# Patient Record
Sex: Female | Born: 1985
Health system: Southern US, Community
[De-identification: ages and names within clinical notes are randomized; demographics above are authoritative.]

## PROBLEM LIST (undated history)

## (undated) DIAGNOSIS — O039 Complete or unspecified spontaneous abortion without complication: Secondary | ICD-10-CM

## (undated) HISTORY — DX: Complete or unspecified spontaneous abortion without complication: O03.9

## (undated) HISTORY — PX: NO PAST SURGERIES: SHX2092

---

## 2012-01-03 DIAGNOSIS — Z3400 Encounter for supervision of normal first pregnancy, unspecified trimester: Secondary | ICD-10-CM | POA: Insufficient documentation

## 2012-05-26 DIAGNOSIS — B3731 Acute candidiasis of vulva and vagina: Secondary | ICD-10-CM | POA: Insufficient documentation

## 2012-05-26 DIAGNOSIS — G5603 Carpal tunnel syndrome, bilateral upper limbs: Secondary | ICD-10-CM | POA: Insufficient documentation

## 2012-06-11 DIAGNOSIS — Z3400 Encounter for supervision of normal first pregnancy, unspecified trimester: Secondary | ICD-10-CM | POA: Insufficient documentation

## 2012-06-17 DIAGNOSIS — O99820 Streptococcus B carrier state complicating pregnancy: Secondary | ICD-10-CM | POA: Insufficient documentation

## 2016-09-13 ENCOUNTER — Encounter: Payer: Self-pay | Admitting: Emergency Medicine

## 2016-09-13 ENCOUNTER — Ambulatory Visit
Admission: EM | Admit: 2016-09-13 | Discharge: 2016-09-13 | Disposition: A | Discharge status: Home / Self Care | Payer: BLUE CROSS/BLUE SHIELD | Attending: Family Medicine | Admitting: Family Medicine

## 2016-09-13 DIAGNOSIS — J0100 Acute maxillary sinusitis, unspecified: Secondary | ICD-10-CM

## 2016-09-13 LAB — RAPID STREP SCREEN (MED CTR MEBANE ONLY): Streptococcus, Group A Screen (Direct): NEGATIVE

## 2016-09-13 MED ORDER — AMOXICILLIN 875 MG PO TABS: 875.0000 mg | ORAL_TABLET | Freq: Two times a day (BID) | ORAL | 0 refills | Status: DC

## 2016-09-13 NOTE — ED Provider Notes (Signed)
[facility]    CSN: [id number] Arrival date & time: 09/13/16  0847     History   Chief Complaint Chief Complaint  Patient presents with  . Otalgia    left ear  . Sore Throat    HPI Sara Burch is a [age] y.o. female.   The history is provided by the patient.  Otalgia  Associated symptoms: congestion, headaches and sore throat   Sore Throat  Associated symptoms include headaches.  URI  Presenting symptoms: congestion, ear pain, facial pain, fatigue and sore throat   Severity:  Moderate Onset quality:  Sudden Duration:  2 weeks Timing:  Constant Progression:  Worsening Chronicity:  New Relieved by:  Nothing Ineffective treatments:  OTC medications Associated symptoms: headaches and sinus pain   Associated symptoms: no wheezing   Risk factors: sick contacts   Risk factors: not elderly, no chronic cardiac disease, no chronic kidney disease, no chronic respiratory disease, no diabetes mellitus, no immunosuppression, no recent illness and no recent travel     History reviewed. No pertinent past medical history.  There are no active problems to display for this patient.   History reviewed. No pertinent surgical history.  OB History    No data available       Home Medications    Prior to Admission medications   Medication Sig Start Date End Date Taking? Authorizing Provider  amoxicillin (AMOXIL) 875 MG tablet Take 1 tablet (875 mg total) by mouth 2 (two) times daily. 09/13/16   Sara Mccallumrlando Sara Javier, MD    Family History History reviewed. No pertinent family history.  Social History Social History  Substance Use Topics  . Smoking status: Never Smoker  . Smokeless tobacco: Never Used  . Alcohol use Yes     Allergies   Patient has no known allergies.   Review of Systems Review of Systems  Constitutional: Positive for fatigue.  HENT: Positive for congestion, ear pain, sinus pain and sore throat.   Respiratory: Negative for wheezing.     Neurological: Positive for headaches.     Physical Exam Triage Vital Signs ED Triage Vitals  Enc Vitals Group     BP 09/13/16 0928 124/78     Pulse Rate 09/13/16 0928 78     Resp 09/13/16 0928 16     Temp 09/13/16 0928 98.1 F (36.7 C)     Temp Source 09/13/16 0928 Oral     SpO2 09/13/16 0928 100 %     Weight 09/13/16 0926 190 lb (86.2 kg)     Height 09/13/16 0926 5\' 6"  (1.676 m)     Head Circumference --      Peak Flow --      Pain Score 09/13/16 0928 4     Pain Loc --      Pain Edu? --      Excl. in GC? --    No data found.   Updated Vital Signs BP 124/78 (BP Location: Left Arm)   Pulse 78   Temp 98.1 F (36.7 C) (Oral)   Resp 16   Ht 5\' 6"  (1.676 m)   Wt 190 lb (86.2 kg)   LMP 08/31/2016 (Exact Date)   SpO2 100%   BMI 30.67 kg/m   Visual Acuity Right Eye Distance:   Left Eye Distance:   Bilateral Distance:    Right Eye Near:   Left Eye Near:    Bilateral Near:     Physical Exam  Constitutional: She appears well-developed and well-nourished. No  distress.  HENT:  Head: Normocephalic and atraumatic.  Right Ear: Tympanic membrane, external ear and ear canal normal.  Left Ear: Tympanic membrane, external ear and ear canal normal.  Nose: Mucosal edema and rhinorrhea present. No nose lacerations, sinus tenderness, nasal deformity, septal deviation or nasal septal hematoma. No epistaxis.  No foreign bodies. Right sinus exhibits maxillary sinus tenderness and frontal sinus tenderness. Left sinus exhibits maxillary sinus tenderness and frontal sinus tenderness.  Mouth/Throat: Uvula is midline, oropharynx is clear and moist and mucous membranes are normal. No oropharyngeal exudate.  Eyes: Conjunctivae and EOM are normal. Pupils are equal, round, and reactive to light. Right eye exhibits no discharge. Left eye exhibits no discharge. No scleral icterus.  Neck: Normal range of motion. Neck supple. No thyromegaly present.  Cardiovascular: Normal rate, regular rhythm  and normal heart sounds.   Pulmonary/Chest: Effort normal and breath sounds normal. No respiratory distress. She has no wheezes. She has no rales.  Lymphadenopathy:    She has no cervical adenopathy.  Skin: She is not diaphoretic.  Nursing note and vitals reviewed.    UC Treatments / Results  Labs (all labs ordered are listed, but only abnormal results are displayed) Labs Reviewed  RAPID STREP SCREEN (NOT AT [facility])  CULTURE, GROUP A STREP [facility])    EKG  EKG Interpretation None       Radiology No results found.  Procedures Procedures (including critical care time)  Medications Ordered in UC Medications - No data to display   Initial Impression / Assessment and Plan / UC Course  I have reviewed the triage vital signs and the nursing notes.  Pertinent labs & imaging results that were available during my care of the patient were reviewed by me and considered in my medical decision making (see chart for details).       Final Clinical Impressions(s) / UC Diagnoses   Final diagnoses:  Acute maxillary sinusitis, recurrence not specified    New Prescriptions Discharge Medication List as of 09/13/2016  9:54 AM    START taking these medications   Details  amoxicillin (AMOXIL) 875 MG tablet Take 1 tablet (875 mg total) by mouth 2 (two) times daily., Starting Sat 09/13/2016, Normal       1. diagnosis reviewed with patient 2. rx as per orders above; reviewed possible side effects, interactions, risks and benefits  3. Recommend supportive treatment with otc flonase 4. Follow-up prn if symptoms worsen or don't improve   Sara Mccallum, MD 09/13/16 1240

## 2016-09-13 NOTE — ED Triage Notes (Signed)
Patient c/o sore throat and left ear pain, runny nose, and congestion for 2 weeks.  Patient denies fevers.

## 2016-09-16 LAB — CULTURE, GROUP A STREP (THRC)

## 2018-07-23 ENCOUNTER — Other Ambulatory Visit: Payer: Self-pay

## 2018-07-23 ENCOUNTER — Ambulatory Visit
Admission: EM | Admit: 2018-07-23 | Discharge: 2018-07-23 | Disposition: A | Discharge status: Home / Self Care | Payer: BLUE CROSS/BLUE SHIELD | Attending: Family Medicine | Admitting: Family Medicine

## 2018-07-23 ENCOUNTER — Encounter: Payer: Self-pay | Admitting: Emergency Medicine

## 2018-07-23 DIAGNOSIS — R509 Fever, unspecified: Secondary | ICD-10-CM | POA: Diagnosis not present

## 2018-07-23 DIAGNOSIS — R51 Headache: Secondary | ICD-10-CM | POA: Diagnosis not present

## 2018-07-23 DIAGNOSIS — J111 Influenza due to unidentified influenza virus with other respiratory manifestations: Secondary | ICD-10-CM

## 2018-07-23 DIAGNOSIS — M791 Myalgia, unspecified site: Secondary | ICD-10-CM | POA: Diagnosis not present

## 2018-07-23 DIAGNOSIS — J029 Acute pharyngitis, unspecified: Secondary | ICD-10-CM | POA: Diagnosis not present

## 2018-07-23 DIAGNOSIS — R69 Illness, unspecified: Secondary | ICD-10-CM | POA: Insufficient documentation

## 2018-07-23 LAB — RAPID INFLUENZA A&B ANTIGENS (ARMC ONLY)
INFLUENZA A (ARMC): NEGATIVE
INFLUENZA B (ARMC): NEGATIVE

## 2018-07-23 LAB — RAPID STREP SCREEN (MED CTR MEBANE ONLY): Streptococcus, Group A Screen (Direct): NEGATIVE

## 2018-07-23 MED ORDER — IBUPROFEN 800 MG PO TABS
800.0000 mg | ORAL_TABLET | Freq: Once | ORAL | Status: AC
  Administered 2018-07-23: 800 mg via ORAL

## 2018-07-23 MED ORDER — OSELTAMIVIR PHOSPHATE 75 MG PO CAPS: 75.0000 mg | ORAL_CAPSULE | Freq: Two times a day (BID) | ORAL | 0 refills | Status: DC

## 2018-07-23 NOTE — ED Notes (Signed)
Pt in treatment room.  In NAD.  Needs address.  Pt/Fam updated on POC.   

## 2018-07-23 NOTE — ED Triage Notes (Signed)
Pt c/o headache, body aches, chills, and low grade fever. Started this morning.

## 2018-07-23 NOTE — Discharge Instructions (Signed)
Rest. Fluids.  Medication as prescribed.   Take care  Dr. Amier Hoyt  

## 2018-07-23 NOTE — ED Provider Notes (Signed)
[facility]    CSN: [id number] Arrival date & time: 07/23/18  1239  History   Chief Complaint Chief Complaint  Patient presents with  . Generalized Body Aches   HPI  33 year old female presents with fever, chills, headaches, body aches, sore throat.  Patient states that her symptoms started this morning.  She reports headache, body aches, chills, fever, and sore throat.  Symptoms are severe.  She feels very poorly.  No medication or interventions tried.  No known exacerbating relieving factors.  She has had recent sick contacts.  No other reported symptoms.  No other complaints/concerns at this time.  PMH, Surgical Hx, Family Hx, Social History reviewed and updated as below.  PMH: Acne.  Past Surgical History:  Procedure Laterality Date  . NO PAST SURGERIES     Home Medications    Prior to Admission medications   Medication Sig Start Date End Date Taking? Authorizing Provider  Cod Liver Oil 10 MINIM CAPS Take by mouth.   Yes [provider]  Multiple Vitamins-Minerals (MULTIVITAMIN ADULT PO) Take by mouth.   Yes [provider]  oseltamivir (TAMIFLU) 75 MG capsule Take 1 capsule (75 mg total) by mouth every 12 (twelve) hours. 07/23/18   Tommie Samsook, Geralene Afshar G, DO    Family History Family History  Problem Relation Age of Onset  . Healthy Mother   . Diabetes Father     Social History Social History   Tobacco Use  . Smoking status: Never Smoker  . Smokeless tobacco: Never Used  Substance Use Topics  . Alcohol use: Yes  . Drug use: No     Allergies   Patient has no known allergies.   Review of Systems Review of Systems Per HPI  Physical Exam Triage Vital Signs ED Triage Vitals  Enc Vitals Group     BP 07/23/18 1306 120/81     Pulse Rate 07/23/18 1306 93     Resp 07/23/18 1306 18     Temp 07/23/18 1306 (!) 101.2 F (38.4 C)     Temp Source 07/23/18 1306 Oral     SpO2 07/23/18 1306 100 %     Weight 07/23/18 1301 185 lb (83.9 kg)       Height 07/23/18 1301 5\' 6"  (1.676 m)     Head Circumference --      Peak Flow --      Pain Score 07/23/18 1301 5     Pain Loc --      Pain Edu? --      Excl. in GC? --    Updated Vital Signs BP 120/81 (BP Location: Left Arm)   Pulse 93   Temp (!) 101.2 F (38.4 C) (Oral)   Resp 18   Ht 5\' 6"  (1.676 m)   Wt 83.9 kg   LMP 07/15/2018 (Approximate)   SpO2 100%   BMI 29.86 kg/m   Visual Acuity Right Eye Distance:   Left Eye Distance:   Bilateral Distance:    Right Eye Near:   Left Eye Near:    Bilateral Near:     Physical Exam Vitals signs and nursing note reviewed.  Constitutional:      General: She is not in acute distress. HENT:     Head: Normocephalic and atraumatic.     Right Ear: Tympanic membrane normal.     Left Ear: Tympanic membrane normal.     Nose: Nose normal.     Mouth/Throat:     Comments: Oropharynx with mild to  moderate erythema.  No exudates.  Cobblestoning noted. Eyes:     General:        Right eye: No discharge.        Left eye: No discharge.     Conjunctiva/sclera: Conjunctivae normal.  Cardiovascular:     Rate and Rhythm: Normal rate and regular rhythm.  Pulmonary:     Effort: Pulmonary effort is normal.     Breath sounds: No wheezing, rhonchi or rales.  Neurological:     Mental Status: She is alert.  Psychiatric:        Mood and Affect: Mood normal.        Behavior: Behavior normal.    UC Treatments / Results  Labs (all labs ordered are listed, but only abnormal results are displayed) Labs Reviewed  RAPID INFLUENZA A&B ANTIGENS ([facility] ONLY)  RAPID STREP SCREEN ([facility] ONLY)  CULTURE, GROUP A STREP [facility])    EKG None  Radiology No results found.  Procedures Procedures (including critical care time)  Medications Ordered in UC Medications  ibuprofen (ADVIL,MOTRIN) tablet 800 mg (800 mg Oral Given 07/23/18 1411)    Initial Impression / Assessment and Plan / UC Course  I have reviewed the triage vital signs and  the nursing notes.  Pertinent labs & imaging results that were available during my care of the patient were reviewed by me and considered in my medical decision making (see chart for details).    33 year old female presents with an influenza-like illness.  Her testing was negative today.  However, clinically I am suspicious that she has influenza.  Treating with Tamiflu.  Final Clinical Impressions(s) / UC Diagnoses   Final diagnoses:  Influenza-like illness     Discharge Instructions     Rest.   Fluids.  Medication as prescribed.  Take care  Dr. Adriana Simas    ED Prescriptions    Medication Sig Dispense Auth. Provider   oseltamivir (TAMIFLU) 75 MG capsule Take 1 capsule (75 mg total) by mouth every 12 (twelve) hours. 10 capsule Tommie Sams, DO     Controlled Substance Prescriptions [place] Controlled Substance Registry consulted? Not Applicable   Tommie Sams, DO 07/23/18 1507

## 2018-07-26 LAB — CULTURE, GROUP A STREP (THRC)

## 2019-04-02 ENCOUNTER — Other Ambulatory Visit: Payer: Self-pay | Admitting: General Practice

## 2019-04-02 DIAGNOSIS — Z20822 Contact with and (suspected) exposure to covid-19: Secondary | ICD-10-CM

## 2019-04-03 LAB — NOVEL CORONAVIRUS, NAA: SARS-CoV-2, NAA: NOT DETECTED

## 2019-04-05 ENCOUNTER — Telehealth: Payer: Self-pay | Admitting: General Practice

## 2019-04-05 NOTE — Telephone Encounter (Signed)
Negative COVID results given. Patient results "NOT Detected." Caller expressed understanding. ° °

## 2019-04-19 ENCOUNTER — Other Ambulatory Visit: Payer: Self-pay

## 2019-04-19 DIAGNOSIS — Z20822 Contact with and (suspected) exposure to covid-19: Secondary | ICD-10-CM

## 2019-04-21 LAB — NOVEL CORONAVIRUS, NAA: SARS-CoV-2, NAA: NOT DETECTED

## 2019-04-27 ENCOUNTER — Telehealth: Payer: Self-pay | Admitting: General Practice

## 2019-04-27 NOTE — Telephone Encounter (Signed)
Pt returned call for covid results.  ° °Advised of Not Detected result.  °

## 2019-05-24 DIAGNOSIS — R69 Illness, unspecified: Secondary | ICD-10-CM | POA: Diagnosis not present

## 2019-09-16 ENCOUNTER — Ambulatory Visit: Payer: Medicaid Other | Attending: Internal Medicine

## 2019-09-16 DIAGNOSIS — Z23 Encounter for immunization: Secondary | ICD-10-CM

## 2019-09-16 NOTE — Progress Notes (Signed)
   Covid-19 Vaccination Clinic  Name:  Sara Burch    MRN: [id number] DOB: 1986-03-05  09/16/2019  [name] was observed post Covid-19 immunization for 15 minutes without incident. She was provided with Vaccine Information Sheet and instruction to access the V-Safe system.   [name] was instructed to call 911 with any severe reactions post vaccine: Marland Kitchen Difficulty breathing  . Swelling of face and throat  . A fast heartbeat  . A bad rash all over body  . Dizziness and weakness   Immunizations Administered    Name Date Dose VIS Date Route   Moderna COVID-19 Vaccine 09/16/2019  9:42 AM 0.5 mL 06/07/2019 Intramuscular   Manufacturer: Moderna   Lot: [id number]   NDC: 16606-301-60

## 2019-10-18 ENCOUNTER — Ambulatory Visit: Payer: Medicaid Other | Attending: Internal Medicine

## 2019-10-18 DIAGNOSIS — Z23 Encounter for immunization: Secondary | ICD-10-CM

## 2019-10-18 NOTE — Progress Notes (Signed)
   Covid-19 Vaccination Clinic  Name:  Sara Burch    MRN: [id number] DOB: [birth year]  10/18/2019  Sara Burch was observed post Covid-19 immunization for 15 minutes without incident. She was provided with Vaccine Information Sheet and instruction to access the V-Safe system.   Sara Burch was instructed to call 911 with any severe reactions post vaccine: Marland Kitchen Difficulty breathing  . Swelling of face and throat  . A fast heartbeat  . A bad rash all over body  . Dizziness and weakness   Immunizations Administered    Name Date Dose VIS Date Route   Moderna COVID-19 Vaccine 10/18/2019  9:36 AM 0.5 mL 06/07/2019 Intramuscular   Manufacturer: Moderna   Lot: [id number]   NDC: 02774-128-78

## 2019-12-14 DIAGNOSIS — Z8739 Personal history of other diseases of the musculoskeletal system and connective tissue: Secondary | ICD-10-CM | POA: Insufficient documentation

## 2019-12-14 NOTE — Progress Notes (Signed)
Scheduled to complete physical 12/21/19 with Ron Smith, PA-C.  AMD 

## 2019-12-15 ENCOUNTER — Other Ambulatory Visit: Payer: Self-pay

## 2019-12-15 ENCOUNTER — Ambulatory Visit: Payer: Self-pay

## 2019-12-15 DIAGNOSIS — Z0000 Encounter for general adult medical examination without abnormal findings: Secondary | ICD-10-CM

## 2019-12-15 LAB — POCT URINALYSIS DIPSTICK
Bilirubin, UA: NEGATIVE
Glucose, UA: NEGATIVE
Ketones, UA: NEGATIVE
Leukocytes, UA: NEGATIVE
Nitrite, UA: NEGATIVE
Protein, UA: NEGATIVE
Spec Grav, UA: 1.015 (ref 1.010–1.025)
Urobilinogen, UA: 0.2 E.U./dL
pH, UA: 7 (ref 5.0–8.0)

## 2019-12-16 LAB — CMP12+LP+TP+TSH+6AC+CBC/D/PLT
ALT: 41 IU/L — ABNORMAL HIGH (ref 0–32)
AST: 32 IU/L (ref 0–40)
Albumin/Globulin Ratio: 1.7 (ref 1.2–2.2)
Albumin: 4.4 g/dL (ref 3.8–4.8)
Alkaline Phosphatase: 66 IU/L (ref 48–121)
BUN/Creatinine Ratio: 15 (ref 9–23)
BUN: 9 mg/dL (ref 6–20)
Basophils Absolute: 0 10*3/uL (ref 0.0–0.2)
Basos: 1 %
Bilirubin Total: 0.3 mg/dL (ref 0.0–1.2)
Calcium: 8.9 mg/dL (ref 8.7–10.2)
Chloride: 103 mmol/L (ref 96–106)
Chol/HDL Ratio: 3.7 ratio (ref 0.0–4.4)
Cholesterol, Total: 198 mg/dL (ref 100–199)
Creatinine, Ser: 0.6 mg/dL (ref 0.57–1.00)
EOS (ABSOLUTE): 0.1 10*3/uL (ref 0.0–0.4)
Eos: 2 %
Estimated CHD Risk: 0.7 times avg. (ref 0.0–1.0)
Free Thyroxine Index: 1.8 (ref 1.2–4.9)
GFR calc Af Amer: 138 mL/min/{1.73_m2} (ref >59)
GFR calc non Af Amer: 119 mL/min/{1.73_m2} (ref >59)
GGT: 17 IU/L (ref 0–60)
Globulin, Total: 2.6 g/dL (ref 1.5–4.5)
Glucose: 132 mg/dL — ABNORMAL HIGH (ref 65–99)
HDL: 54 mg/dL (ref >39)
Hematocrit: 38.1 % (ref 34.0–46.6)
Hemoglobin: 12.5 g/dL (ref 11.1–15.9)
Immature Grans (Abs): 0 10*3/uL (ref 0.0–0.1)
Immature Granulocytes: 0 %
Iron: 43 ug/dL (ref 27–159)
LDH: 186 IU/L (ref 119–226)
LDL Chol Calc (NIH): 134 mg/dL — ABNORMAL HIGH (ref 0–99)
Lymphocytes Absolute: 1.7 10*3/uL (ref 0.7–3.1)
Lymphs: 45 %
MCH: 29.8 pg (ref 26.6–33.0)
MCHC: 32.8 g/dL (ref 31.5–35.7)
MCV: 91 fL (ref 79–97)
Monocytes Absolute: 0.3 10*3/uL (ref 0.1–0.9)
Monocytes: 8 %
Neutrophils Absolute: 1.6 10*3/uL (ref 1.4–7.0)
Neutrophils: 44 %
Phosphorus: 2.6 mg/dL — ABNORMAL LOW (ref 3.0–4.3)
Platelets: 184 10*3/uL (ref 150–450)
Potassium: 4.3 mmol/L (ref 3.5–5.2)
RBC: 4.2 x10E6/uL (ref 3.77–5.28)
RDW: 12.8 % (ref 11.7–15.4)
Sodium: 139 mmol/L (ref 134–144)
T3 Uptake Ratio: 30 % (ref 24–39)
T4, Total: 6.1 ug/dL (ref 4.5–12.0)
TSH: 1.38 u[IU]/mL (ref 0.450–4.500)
Total Protein: 7 g/dL (ref 6.0–8.5)
Triglycerides: 56 mg/dL (ref 0–149)
Uric Acid: 4.1 mg/dL (ref 2.6–6.2)
VLDL Cholesterol Cal: 10 mg/dL (ref 5–40)
WBC: 3.7 10*3/uL (ref 3.4–10.8)

## 2019-12-21 ENCOUNTER — Ambulatory Visit: Payer: Self-pay | Admitting: Physician Assistant

## 2019-12-21 ENCOUNTER — Other Ambulatory Visit: Payer: Self-pay

## 2019-12-21 ENCOUNTER — Encounter: Payer: Self-pay | Admitting: Physician Assistant

## 2019-12-21 VITALS — BP 138/83 | Resp 14 | Ht 66.0 in | Wt 213.0 lb

## 2019-12-21 DIAGNOSIS — Z0000 Encounter for general adult medical examination without abnormal findings: Secondary | ICD-10-CM

## 2019-12-21 DIAGNOSIS — R7309 Other abnormal glucose: Secondary | ICD-10-CM

## 2019-12-21 LAB — POCT GLYCOSYLATED HEMOGLOBIN (HGB A1C): Hemoglobin A1C: 5.7 % — AB (ref 4.0–5.6)

## 2019-12-21 NOTE — Progress Notes (Signed)
° °  Subjective: Annual physical exam    Patient ID: Sara Burch, female    DOB: [birth year], 34 y.o.   MRN: [id number]  HPI Patient presents for annual physical exam voices no concerns or complaints.   Review of Systems    Negative Objective:   Physical Exam No acute distress.  BMI is 34.38.  HEENT is unremarkable.  Neck is supple for adenopathy or bruits.  Lungs are clear to auscultation.  Heart is regular rate and rhythm.  Abdomen negative HSM, normoactive bowel sounds, soft, and nontender to palpation.  No obvious deformity to the upper or lower extremities.  Patient has full and equal range of motion of the upper and lower extremities.  No obvious deformity to the cervical or lumbar spine.  Patient has full equal range of motion of the cervical and lumbar spine.  Cranial nerves II through XII grossly intact.       Assessment & Plan: Well exam.  Patient fasting glucose was 132.  Due to the patient's history of diabetes on both sides of the family will get a hemoglobin A1c

## 2020-07-10 ENCOUNTER — Encounter: Payer: Medicaid Other | Admitting: Obstetrics and Gynecology

## 2020-07-12 ENCOUNTER — Ambulatory Visit (INDEPENDENT_AMBULATORY_CARE_PROVIDER_SITE_OTHER): Payer: 59 | Admitting: Obstetrics and Gynecology

## 2020-07-12 ENCOUNTER — Other Ambulatory Visit: Payer: Self-pay

## 2020-07-12 ENCOUNTER — Encounter: Payer: Self-pay | Admitting: Obstetrics and Gynecology

## 2020-07-12 VITALS — BP 140/67 | HR 105 | Ht 66.0 in | Wt 217.6 lb

## 2020-07-12 DIAGNOSIS — O09521 Supervision of elderly multigravida, first trimester: Secondary | ICD-10-CM

## 2020-07-12 DIAGNOSIS — Z8759 Personal history of other complications of pregnancy, childbirth and the puerperium: Secondary | ICD-10-CM | POA: Diagnosis not present

## 2020-07-12 DIAGNOSIS — N926 Irregular menstruation, unspecified: Secondary | ICD-10-CM

## 2020-07-12 LAB — POCT URINE PREGNANCY: Preg Test, Ur: POSITIVE — AB

## 2020-07-12 NOTE — Progress Notes (Signed)
HPI:      [name] is a 35 y.o. G1P0 who LMP was Patient's last menstrual period was 05/25/2020.  Subjective:   She presents today for pregnancy confirmation.  She has missed a menstrual period.  She was not preventing pregnancy and is excited about being pregnant.  She is currently taking prenatal vitamins.  She has occasional nausea but no vomiting. Her previous birth was complicated by 9 pound 6 ounce infant vaginal birth but otherwise without issue.  Patient desires natural childbirth with this baby.  She did breast-feed last pregnancy and plans to breast-feed again.    Hx: The following portions of the patient's history were reviewed and updated as appropriate:             She  has no past medical history on file. She does not have any pertinent problems on file. She  has a past surgical history that includes No past surgeries. Her family history includes Diabetes in her father; Healthy in her mother. She  reports that she has never smoked. She has never used smokeless tobacco. She reports current alcohol use. She reports that she does not use drugs. She has a current medication list which includes the following prescription(s): apple cider vinegar, cetirizine, cod liver oil, and multivitamin-prenatal. She has No Known Allergies.       Review of Systems:  Review of Systems  Constitutional: Denied constitutional symptoms, night sweats, recent illness, fatigue, fever, insomnia and weight loss.  Eyes: Denied eye symptoms, eye pain, photophobia, vision change and visual disturbance.  Ears/Nose/Throat/Neck: Denied ear, nose, throat or neck symptoms, hearing loss, nasal discharge, sinus congestion and sore throat.  Cardiovascular: Denied cardiovascular symptoms, arrhythmia, chest pain/pressure, edema, exercise intolerance, orthopnea and palpitations.  Respiratory: Denied pulmonary symptoms, asthma, pleuritic pain, productive sputum, cough, dyspnea and wheezing.  Gastrointestinal:  Denied, gastro-esophageal reflux, melena, nausea and vomiting.  Genitourinary: Denied genitourinary symptoms including symptomatic vaginal discharge, pelvic relaxation issues, and urinary complaints.  Musculoskeletal: Denied musculoskeletal symptoms, stiffness, swelling, muscle weakness and myalgia.  Dermatologic: Denied dermatology symptoms, rash and scar.  Neurologic: Denied neurology symptoms, dizziness, headache, neck pain and syncope.  Psychiatric: Denied psychiatric symptoms, anxiety and depression.  Endocrine: Denied endocrine symptoms including hot flashes and night sweats.   Meds:   Current Outpatient Medications on File Prior to Visit  Medication Sig Dispense Refill  . APPLE CIDER VINEGAR PO Take by mouth. liquid    . cetirizine (ZYRTEC) 10 MG tablet Take by mouth.    Marland Kitchen Cod Liver Oil 10 MINIM CAPS Take by mouth.    . Prenatal Vit-Fe Fumarate-FA (MULTIVITAMIN-PRENATAL) 27-0.8 MG TABS tablet Take 1 tablet by mouth daily at 12 noon.     No current facility-administered medications on file prior to visit.          Objective:     Vitals:   07/12/20 1020  BP: 140/67  Pulse: (!) 105   Filed Weights   07/12/20 1020  Weight: 217 lb 9.6 oz (98.7 kg)              Urinary pregnancy test positive  Assessment:    G1P0 Patient Active Problem List   Diagnosis Date Noted  . H/O fibromyalgia 12/14/2019  . GBS (group B Streptococcus carrier), +RV culture, currently pregnant 06/17/2012  . Supervision of normal first pregnancy 06/11/2012  . Carpal tunnel syndrome, bilateral 05/26/2012  . Monilial vaginitis 05/26/2012  . Family history of thyroid disorder 01/03/2012     1. Missed period  2. Multigravida of advanced maternal age in first trimester   3. History of delivery of macrosomal infant        Plan:            Prenatal Plan 1.  The patient was given prenatal literature. 2.  She was continued on prenatal vitamins. 3.  A prenatal lab panel to be drawn at nurse  visit. 4.  An ultrasound was ordered to better determine an EDC. 5.  A nurse visit was scheduled. 6.  Genetic testing and testing for other inheritable conditions discussed in detail. She will decide in the future whether to have these labs performed. 7.  A general overview of pregnancy testing, visit schedule, ultrasound schedule, and prenatal care was discussed. 8.  COVID and its risks associated with pregnancy, prevention by limiting exposure and use of masks, as well as the risks and benefits of vaccination during pregnancy were discussed in detail.  [facility] policy regarding office and hospital visitation and testing was explained. 9.  Benefits of breast-feeding discussed in detail including both maternal and infant benefits. Ready Set Baby website discussed. 10.  Because of her history of macrosomia I recommended second and third trimester GCT. 11.  Advanced maternal age discussed.  Genetic testing as above discussed. 12.  Baby aspirin beginning at 13/14 weeks discussed.   Orders Orders Placed This Encounter  Procedures  . POCT urine pregnancy    No orders of the defined types were placed in this encounter.     F/U  Return in about 6 weeks (around 08/23/2020).  Elonda Husky, M.D. 07/12/2020 11:02 AM

## 2020-07-19 ENCOUNTER — Other Ambulatory Visit: Payer: Self-pay

## 2020-07-19 ENCOUNTER — Ambulatory Visit (INDEPENDENT_AMBULATORY_CARE_PROVIDER_SITE_OTHER): Payer: 59

## 2020-07-19 DIAGNOSIS — Z8759 Personal history of other complications of pregnancy, childbirth and the puerperium: Secondary | ICD-10-CM | POA: Diagnosis not present

## 2020-07-19 DIAGNOSIS — O09521 Supervision of elderly multigravida, first trimester: Secondary | ICD-10-CM

## 2020-07-19 DIAGNOSIS — Z3A01 Less than 8 weeks gestation of pregnancy: Secondary | ICD-10-CM | POA: Diagnosis not present

## 2020-07-19 DIAGNOSIS — N926 Irregular menstruation, unspecified: Secondary | ICD-10-CM

## 2020-07-27 ENCOUNTER — Telehealth: Payer: Self-pay

## 2020-07-27 NOTE — Telephone Encounter (Signed)
Pt called in and stated that she is [redacted] week pregnant, The pt stated that has been spotting and its dark brown. The pt stated that she is in no pain. The pt is requesting a call back. Please advise

## 2020-07-27 NOTE — Telephone Encounter (Signed)
Tried to call patient back. LM for patient to return call.

## 2020-07-27 NOTE — Telephone Encounter (Signed)
OB 8 weeks U/s- 1/13  Spotting/ brown discharge x 1 week.   Only when she wipes. She is not wearing a pad.  NO uti sx.  BM- normal.  Here and there cramps.- no meds needed.   IC -4 days ago.   Pt advised small bleeding in 1st trimester is normal.   Monitor for now. If she is bleeding like period or having severe cramps not relieved with tylenol to contact the office or go to ED.   Education send via my chart.   Pt voiced understanding.

## 2020-07-31 NOTE — Telephone Encounter (Signed)
Patient called in stating that since this phone call she has had an increase in the blood she is passing as well as the color changing to a light red color. Patient states that she is having some abdominal cramping. Patient is unsure if this should be a cause of concern. Could you please advise?

## 2020-08-01 ENCOUNTER — Other Ambulatory Visit: Payer: Self-pay

## 2020-08-01 ENCOUNTER — Other Ambulatory Visit: Payer: Self-pay | Admitting: Obstetrics and Gynecology

## 2020-08-01 ENCOUNTER — Other Ambulatory Visit: Payer: Self-pay | Admitting: Surgical

## 2020-08-01 ENCOUNTER — Telehealth: Payer: Self-pay

## 2020-08-01 ENCOUNTER — Ambulatory Visit
Admission: RE | Admit: 2020-08-01 | Discharge: 2020-08-01 | Disposition: A | Discharge status: Home / Self Care | Payer: 59 | Source: Ambulatory Visit | Attending: Obstetrics and Gynecology | Admitting: Obstetrics and Gynecology

## 2020-08-01 DIAGNOSIS — O209 Hemorrhage in early pregnancy, unspecified: Secondary | ICD-10-CM | POA: Diagnosis not present

## 2020-08-01 DIAGNOSIS — R9389 Abnormal findings on diagnostic imaging of other specified body structures: Secondary | ICD-10-CM | POA: Diagnosis not present

## 2020-08-01 DIAGNOSIS — N939 Abnormal uterine and vaginal bleeding, unspecified: Secondary | ICD-10-CM | POA: Diagnosis not present

## 2020-08-01 DIAGNOSIS — Z3A00 Weeks of gestation of pregnancy not specified: Secondary | ICD-10-CM | POA: Diagnosis not present

## 2020-08-01 NOTE — Telephone Encounter (Signed)
Pt called in and stated that she had a u/s today at the hospital and the pt is wanting to know if the results have came in. I told the pt I can send a message to the nurse, that when the provider receives the results he will go over them and someone will call you. Please advise

## 2020-08-01 NOTE — Telephone Encounter (Signed)
OB 9 5/7 Over the weekend- brown discharge/spotting.  No pain.  Monday- had diarrhea/nausea/chills- daughter had stomach bug VB - bright red blood- looked like a normal period. Look like tissue coming out. Size of quarter.    Changed her pad 2-3 times.  No pain or cramps.   Appt has an appt NOB intake tomorrow.  JW aware. JW to contact pt .

## 2020-08-01 NOTE — Telephone Encounter (Signed)
Per Dr. Valentino Saxon and Dr. Logan Bores I have spoke with patient about results of [place]. Patient had a miscarriage. I have scheduled patient to come in next week for check up.

## 2020-08-01 NOTE — Telephone Encounter (Signed)
See previous message

## 2020-08-01 NOTE — Telephone Encounter (Signed)
Spoke with patient and she has been having brownish bleeding since Friday. Patient started having bright red bleeding yesterday with cramping. She was also passing tissue. Cramping has gotten better today. Per Dr. Logan Bores I have ordered an [place] to be done at the hospital. Patient is aware.

## 2020-08-08 ENCOUNTER — Encounter: Payer: Self-pay | Admitting: Obstetrics and Gynecology

## 2020-08-08 ENCOUNTER — Other Ambulatory Visit: Payer: Self-pay

## 2020-08-08 ENCOUNTER — Ambulatory Visit (INDEPENDENT_AMBULATORY_CARE_PROVIDER_SITE_OTHER): Payer: 59 | Admitting: Obstetrics and Gynecology

## 2020-08-08 VITALS — BP 135/85 | HR 91 | Ht 66.0 in | Wt 219.0 lb

## 2020-08-08 DIAGNOSIS — O039 Complete or unspecified spontaneous abortion without complication: Secondary | ICD-10-CM

## 2020-08-08 NOTE — Progress Notes (Signed)
HPI:      [name] is a 36 y.o. G1P0 who LMP was Patient's last menstrual period was 05/25/2020.  Subjective:   She presents today She presents today after having a miscarriage over the weekend.  She was previously noted to be pregnant with mono/mono twins.  A subsequent ultrasound after passing tissue noted an empty uterus.  She is currently having light bleeding and occasional cramping. She would like to discuss possible causes of miscarriage and future miscarriage risk. She has not yet decided on birth control versus return to pregnancy in the near future.    Hx: The following portions of the patient's history were reviewed and updated as appropriate:             She  has no past medical history on file. She does not have any pertinent problems on file. She  has a past surgical history that includes No past surgeries. Her family history includes Diabetes in her father; Healthy in her mother. She  reports that she has never smoked. She has never used smokeless tobacco. She reports current alcohol use. She reports that she does not use drugs. She has a current medication list which includes the following prescription(s): apple cider vinegar, cetirizine, cod liver oil, and multivitamin-prenatal. She has No Known Allergies.       Review of Systems:  Review of Systems  Constitutional: Denied constitutional symptoms, night sweats, recent illness, fatigue, fever, insomnia and weight loss.  Eyes: Denied eye symptoms, eye pain, photophobia, vision change and visual disturbance.  Ears/Nose/Throat/Neck: Denied ear, nose, throat or neck symptoms, hearing loss, nasal discharge, sinus congestion and sore throat.  Cardiovascular: Denied cardiovascular symptoms, arrhythmia, chest pain/pressure, edema, exercise intolerance, orthopnea and palpitations.  Respiratory: Denied pulmonary symptoms, asthma, pleuritic pain, productive sputum, cough, dyspnea and wheezing.  Gastrointestinal: Denied,  gastro-esophageal reflux, melena, nausea and vomiting.  Genitourinary: Denied genitourinary symptoms including symptomatic vaginal discharge, pelvic relaxation issues, and urinary complaints.  Musculoskeletal: Denied musculoskeletal symptoms, stiffness, swelling, muscle weakness and myalgia.  Dermatologic: Denied dermatology symptoms, rash and scar.  Neurologic: Denied neurology symptoms, dizziness, headache, neck pain and syncope.  Psychiatric: Denied psychiatric symptoms, anxiety and depression.  Endocrine: Denied endocrine symptoms including hot flashes and night sweats.   Meds:   Current Outpatient Medications on File Prior to Visit  Medication Sig Dispense Refill  . APPLE CIDER VINEGAR PO Take by mouth. liquid    . cetirizine (ZYRTEC) 10 MG tablet Take by mouth.    Marland Kitchen Cod Liver Oil 10 MINIM CAPS Take by mouth.    . Prenatal Vit-Fe Fumarate-FA (MULTIVITAMIN-PRENATAL) 27-0.8 MG TABS tablet Take 1 tablet by mouth daily at 12 noon.     No current facility-administered medications on file prior to visit.       The pregnancy intention screening data noted above was reviewed. Potential methods of contraception were discussed. The patient elected to proceed with Unknown/Not Reported.     Objective:     Vitals:   08/08/20 0848  BP: 135/85  Pulse: 91   Filed Weights   08/08/20 0848  Weight: 219 lb (99.3 kg)                Assessment:    G1P0 Patient Active Problem List   Diagnosis Date Noted  . H/O fibromyalgia 12/14/2019  . GBS (group B Streptococcus carrier), +RV culture, currently pregnant 06/17/2012  . Supervision of normal first pregnancy 06/11/2012  . Carpal tunnel syndrome, bilateral 05/26/2012  . Monilial vaginitis  05/26/2012  . Family history of thyroid disorder 01/03/2012     1. Miscarriage     Based on ultrasound and patient's bleeding pattern she most likely has had a completed miscarriage.   Plan:            1.  We have discussed completed  miscarriage in detail.  Causes of miscarriage and future possibility of miscarriage discussed in detail.  I have answered all of her questions.  We have also discussed possible birth control methods versus future pregnancy.  She has yet to decide and will inform us when she has made a decision. 2.  Plan to follow quantitative beta hCGs to 0.  Recommend quant in 1 week. 3.  Counseling regarding mourning after pregnancy loss discussed.  [facility] counseling discussed.  Patient currently has a counselor that she is comfortable with.  Orders Orders Placed This Encounter  Procedures  . Beta hCG quant (ref lab)    No orders of the defined types were placed in this encounter.     F/U  No follow-ups on file. I spent 27 minutes involved in the care of this patient preparing to see the patient by obtaining and reviewing her medical history (including labs, imaging tests and prior procedures), documenting clinical information in the electronic health record (EHR), counseling and coordinating care plans, writing and sending prescriptions, ordering tests or procedures and directly communicating with the patient by discussing pertinent items from her history and physical exam as well as detailing my assessment and plan as noted above so that she has an informed understanding.  All of her questions were answered.  Elonda Husky, M.D. 08/08/2020 9:55 AM

## 2020-08-16 ENCOUNTER — Other Ambulatory Visit: Payer: 59

## 2020-08-16 ENCOUNTER — Other Ambulatory Visit: Payer: Self-pay

## 2020-08-16 DIAGNOSIS — O039 Complete or unspecified spontaneous abortion without complication: Secondary | ICD-10-CM

## 2020-08-17 LAB — BETA HCG QUANT (REF LAB): hCG Quant: 5 m[IU]/mL

## 2020-08-23 ENCOUNTER — Encounter: Payer: 59 | Admitting: Obstetrics and Gynecology

## 2020-10-09 DIAGNOSIS — R69 Illness, unspecified: Secondary | ICD-10-CM | POA: Diagnosis not present

## 2020-11-06 DIAGNOSIS — R69 Illness, unspecified: Secondary | ICD-10-CM | POA: Diagnosis not present

## 2020-11-07 ENCOUNTER — Other Ambulatory Visit: Payer: Self-pay

## 2020-11-07 ENCOUNTER — Ambulatory Visit: Payer: Self-pay

## 2020-11-07 DIAGNOSIS — Z0000 Encounter for general adult medical examination without abnormal findings: Secondary | ICD-10-CM

## 2020-11-07 LAB — POCT URINALYSIS DIPSTICK
Bilirubin, UA: NEGATIVE
Blood, UA: POSITIVE
Glucose, UA: NEGATIVE
Ketones, UA: NEGATIVE
Leukocytes, UA: NEGATIVE
Nitrite, UA: NEGATIVE
Protein, UA: NEGATIVE
Spec Grav, UA: 1.015 (ref 1.010–1.025)
Urobilinogen, UA: 0.2 E.U./dL
pH, UA: 6 (ref 5.0–8.0)

## 2020-11-07 NOTE — Progress Notes (Signed)
Pt scheduled to complete physical with [name] 11/16/20. Gretel Acre

## 2020-11-08 LAB — CMP12+LP+TP+TSH+6AC+CBC/D/PLT
ALT: 16 IU/L (ref 0–32)
AST: 15 IU/L (ref 0–40)
Albumin/Globulin Ratio: 1.6 (ref 1.2–2.2)
Albumin: 4.4 g/dL (ref 3.8–4.8)
Alkaline Phosphatase: 67 IU/L (ref 44–121)
BUN/Creatinine Ratio: 13 (ref 9–23)
BUN: 8 mg/dL (ref 6–20)
Basophils Absolute: 0 10*3/uL (ref 0.0–0.2)
Basos: 1 %
Bilirubin Total: 0.3 mg/dL (ref 0.0–1.2)
Calcium: 9.1 mg/dL (ref 8.7–10.2)
Chloride: 100 mmol/L (ref 96–106)
Chol/HDL Ratio: 4.1 ratio (ref 0.0–4.4)
Cholesterol, Total: 207 mg/dL — ABNORMAL HIGH (ref 100–199)
Creatinine, Ser: 0.61 mg/dL (ref 0.57–1.00)
EOS (ABSOLUTE): 0 10*3/uL (ref 0.0–0.4)
Eos: 1 %
Estimated CHD Risk: 0.9 times avg. (ref 0.0–1.0)
Free Thyroxine Index: 1.9 (ref 1.2–4.9)
GGT: 14 IU/L (ref 0–60)
Globulin, Total: 2.8 g/dL (ref 1.5–4.5)
Glucose: 98 mg/dL (ref 65–99)
HDL: 50 mg/dL (ref >39)
Hematocrit: 37 % (ref 34.0–46.6)
Hemoglobin: 12.4 g/dL (ref 11.1–15.9)
Immature Grans (Abs): 0 10*3/uL (ref 0.0–0.1)
Immature Granulocytes: 0 %
Iron: 57 ug/dL (ref 27–159)
LDH: 171 IU/L (ref 119–226)
LDL Chol Calc (NIH): 148 mg/dL — ABNORMAL HIGH (ref 0–99)
Lymphocytes Absolute: 2.1 10*3/uL (ref 0.7–3.1)
Lymphs: 49 %
MCH: 29.5 pg (ref 26.6–33.0)
MCHC: 33.5 g/dL (ref 31.5–35.7)
MCV: 88 fL (ref 79–97)
Monocytes Absolute: 0.4 10*3/uL (ref 0.1–0.9)
Monocytes: 10 %
Neutrophils Absolute: 1.6 10*3/uL (ref 1.4–7.0)
Neutrophils: 39 %
Phosphorus: 2.8 mg/dL — ABNORMAL LOW (ref 3.0–4.3)
Platelets: 185 10*3/uL (ref 150–450)
Potassium: 4.3 mmol/L (ref 3.5–5.2)
RBC: 4.21 x10E6/uL (ref 3.77–5.28)
RDW: 13.3 % (ref 11.7–15.4)
Sodium: 136 mmol/L (ref 134–144)
T3 Uptake Ratio: 31 % (ref 24–39)
T4, Total: 6 ug/dL (ref 4.5–12.0)
TSH: 1.64 u[IU]/mL (ref 0.450–4.500)
Total Protein: 7.2 g/dL (ref 6.0–8.5)
Triglycerides: 49 mg/dL (ref 0–149)
Uric Acid: 4.1 mg/dL (ref 2.6–6.2)
VLDL Cholesterol Cal: 9 mg/dL (ref 5–40)
WBC: 4.2 10*3/uL (ref 3.4–10.8)
eGFR: 119 mL/min/{1.73_m2} (ref >59)

## 2020-11-16 ENCOUNTER — Encounter: Payer: Self-pay | Admitting: Physician Assistant

## 2020-11-16 ENCOUNTER — Ambulatory Visit: Payer: Self-pay | Admitting: Physician Assistant

## 2020-11-16 ENCOUNTER — Other Ambulatory Visit: Payer: Self-pay

## 2020-11-16 VITALS — BP 126/74 | HR 85 | Temp 97.7°F | Resp 14 | Ht 66.0 in | Wt 221.0 lb

## 2020-11-16 DIAGNOSIS — Z0000 Encounter for general adult medical examination without abnormal findings: Secondary | ICD-10-CM

## 2020-11-16 NOTE — Progress Notes (Signed)
   Subjective: Annual physical exam    Patient ID: Sara Burch, female    DOB: 06-19-1986, 35 y.o.   MRN: [id number]  HPI Patient presents for annual physical exam.  Patient voices no concerns or complaints.   Review of Systems    Negative  Objective:   Physical Exam  No acute distress.  Temperature 97.7, pulse 85, respiration 14, BP is 126/74, O2 sat is 96% on room air, weight is 221 pounds, BMI is 35.7. HEENT is unremarkable.  Neck is supple for adenopathy or bruits.  Lungs are clear to auscultation.  Heart regular rate and rhythm. Abdomen is negative HSM, normoactive bowel sounds, soft, nontender to palpation. No obvious deformity to the upper or lower extremities.  Patient has full and equal range of motion of upper and lower extremities. No obvious cervical or lumbar spine deformity.  Patient has full and equal range of motion cervical lumbar spine. Cranial nerves II through XII grossly intact.      Assessment & Plan: Well exam.  Discussed patient increase of cholesterol from 198 last year to 207.  Discussed rationale for diet and exercise.  Advised to follow-up as necessary.

## 2020-12-02 DIAGNOSIS — Z3202 Encounter for pregnancy test, result negative: Secondary | ICD-10-CM | POA: Diagnosis not present

## 2020-12-02 DIAGNOSIS — N939 Abnormal uterine and vaginal bleeding, unspecified: Secondary | ICD-10-CM | POA: Diagnosis not present

## 2020-12-02 DIAGNOSIS — R8281 Pyuria: Secondary | ICD-10-CM | POA: Diagnosis not present

## 2020-12-02 DIAGNOSIS — N938 Other specified abnormal uterine and vaginal bleeding: Secondary | ICD-10-CM | POA: Diagnosis not present

## 2020-12-04 ENCOUNTER — Telehealth: Payer: Self-pay | Admitting: Obstetrics and Gynecology

## 2020-12-04 NOTE — Telephone Encounter (Signed)
Patient called stating that she had a positive pregnancy test on Tuesday - started having light pink bleeding light cramping Sunday - went to Urgent Care where they did blood work - patient states "still has pregnancy chemical" Patient states a recent hx of miscarriage. Patient states bright red bleeding still- no pain, wants to know if she needs to come in for a visit today. Please Advise.

## 2020-12-04 NOTE — Telephone Encounter (Signed)
Do you want patient to come in or have her get another beta. It looks like her Beta from 12/02/2020 was at a 9.

## 2020-12-05 NOTE — Telephone Encounter (Signed)
Scheduled patient to come in to see Dr. Logan Bores.

## 2020-12-10 ENCOUNTER — Encounter: Payer: Self-pay | Admitting: Obstetrics and Gynecology

## 2020-12-10 ENCOUNTER — Ambulatory Visit (INDEPENDENT_AMBULATORY_CARE_PROVIDER_SITE_OTHER): Payer: 59 | Admitting: Obstetrics and Gynecology

## 2020-12-10 ENCOUNTER — Other Ambulatory Visit: Payer: Self-pay

## 2020-12-10 VITALS — BP 120/80 | HR 77 | Ht 66.0 in | Wt 225.4 lb

## 2020-12-10 DIAGNOSIS — O039 Complete or unspecified spontaneous abortion without complication: Secondary | ICD-10-CM

## 2020-12-10 NOTE — Progress Notes (Signed)
HPI:      [name] is a 35 y.o. [phone] who LMP was Patient's last menstrual period was 12/02/2020 (exact date).  Subjective:   She presents today to discuss her most recent pregnancy loss.  This is her second pregnancy loss in a row.  Approximately 5 months ago she had mono mono twins and had a first trimester loss.  This pregnancy she had a home positive pregnancy test, some vaginal bleeding, and then a serum pregnancy test that was 9. Of significant note patient has had a previous successful pregnancy. Her current partner has not fathered any children (same partner for the last 2 losses.)    Hx: The following portions of the patient's history were reviewed and updated as appropriate:             She  has a past medical history of Miscarriage. She does not have any pertinent problems on file. She  has a past surgical history that includes No past surgeries. Her family history includes Diabetes in her father; Healthy in her mother. She  reports that she has never smoked. She has never used smokeless tobacco. She reports current alcohol use. She reports that she does not use drugs. She has a current medication list which includes the following prescription(s): apple cider vinegar, calcium-magnesium-vitamin d, cetirizine, cod liver oil, and multivitamin-prenatal. She has No Known Allergies.       Review of Systems:  Review of Systems  Constitutional: Denied constitutional symptoms, night sweats, recent illness, fatigue, fever, insomnia and weight loss.  Eyes: Denied eye symptoms, eye pain, photophobia, vision change and visual disturbance.  Ears/Nose/Throat/Neck: Denied ear, nose, throat or neck symptoms, hearing loss, nasal discharge, sinus congestion and sore throat.  Cardiovascular: Denied cardiovascular symptoms, arrhythmia, chest pain/pressure, edema, exercise intolerance, orthopnea and palpitations.  Respiratory: Denied pulmonary symptoms, asthma, pleuritic pain, productive  sputum, cough, dyspnea and wheezing.  Gastrointestinal: Denied, gastro-esophageal reflux, melena, nausea and vomiting.  Genitourinary: Denied genitourinary symptoms including symptomatic vaginal discharge, pelvic relaxation issues, and urinary complaints.  Musculoskeletal: Denied musculoskeletal symptoms, stiffness, swelling, muscle weakness and myalgia.  Dermatologic: Denied dermatology symptoms, rash and scar.  Neurologic: Denied neurology symptoms, dizziness, headache, neck pain and syncope.  Psychiatric: Denied psychiatric symptoms, anxiety and depression.  Endocrine: Denied endocrine symptoms including hot flashes and night sweats.   Meds:   Current Outpatient Medications on File Prior to Visit  Medication Sig Dispense Refill  . APPLE CIDER VINEGAR PO Take by mouth. liquid    . Calcium-Magnesium-Vitamin D (CALCIUM MAGNESIUM PO) Take by mouth.    . cetirizine (ZYRTEC) 10 MG tablet Take by mouth.    Marland Kitchen Cod Liver Oil 10 MINIM CAPS Take by mouth.    . Prenatal Vit-Fe Fumarate-FA (MULTIVITAMIN-PRENATAL) 27-0.8 MG TABS tablet Take 1 tablet by mouth daily at 12 noon.     No current facility-administered medications on file prior to visit.          Objective:     Vitals:   12/10/20 1027  BP: 120/80  Pulse: 77   Filed Weights   12/10/20 1027  Weight: 225 lb 6.4 oz (102.2 kg)                Assessment:    [id number] Patient Active Problem List   Diagnosis Date Noted  . H/O fibromyalgia 12/14/2019  . GBS (group B Streptococcus carrier), +RV culture, currently pregnant 06/17/2012  . Supervision of normal first pregnancy 06/11/2012  . Carpal tunnel syndrome, bilateral 05/26/2012  .  Monilial vaginitis 05/26/2012  . Family history of thyroid disorder 01/03/2012     1. Miscarriage        Plan:            1.  We discussed miscarriage in detail.  Recurrent pregnancy loss discussed.  Chances of future pregnancy loss discussed.  Possible work-up for infertility including  karyotype, anticardiolipin antibody, TSH, day 3 FSH, AMH, and lupus anticoagulant discussed.  She understands that this type of work-up is typically undertaken after 3 previous losses.  Fact that she had a successful pregnancy is important.  2.  She has not ready at this time to begin infertility work-up.  We will most likely attempt pregnancy.  3.  To consider progesterone if patient becomes pregnant in the near future. Orders No orders of the defined types were placed in this encounter.   No orders of the defined types were placed in this encounter.     F/U  No follow-ups on file. I spent 23 minutes involved in the care of this patient preparing to see the patient by obtaining and reviewing her medical history (including labs, imaging tests and prior procedures), documenting clinical information in the electronic health record (EHR), counseling and coordinating care plans, writing and sending prescriptions, ordering tests or procedures and directly communicating with the patient by discussing pertinent items from her history and physical exam as well as detailing my assessment and plan as noted above so that she has an informed understanding.  All of her questions were answered.  Elonda Husky, M.D. 12/10/2020 11:02 AM

## 2020-12-17 DIAGNOSIS — L309 Dermatitis, unspecified: Secondary | ICD-10-CM | POA: Diagnosis not present

## 2020-12-17 DIAGNOSIS — L815 Leukoderma, not elsewhere classified: Secondary | ICD-10-CM | POA: Diagnosis not present

## 2020-12-18 ENCOUNTER — Other Ambulatory Visit: Payer: Self-pay

## 2020-12-18 ENCOUNTER — Ambulatory Visit (INDEPENDENT_AMBULATORY_CARE_PROVIDER_SITE_OTHER): Payer: 59 | Admitting: Obstetrics and Gynecology

## 2020-12-18 ENCOUNTER — Other Ambulatory Visit (HOSPITAL_COMMUNITY)
Admission: RE | Admit: 2020-12-18 | Discharge: 2020-12-18 | Disposition: A | Discharge status: Home / Self Care | Payer: 59 | Source: Ambulatory Visit | Attending: Obstetrics and Gynecology | Admitting: Obstetrics and Gynecology

## 2020-12-18 ENCOUNTER — Encounter: Payer: Self-pay | Admitting: Obstetrics and Gynecology

## 2020-12-18 VITALS — BP 143/86 | HR 84 | Resp 16 | Ht 66.0 in | Wt 226.5 lb

## 2020-12-18 DIAGNOSIS — R21 Rash and other nonspecific skin eruption: Secondary | ICD-10-CM

## 2020-12-18 DIAGNOSIS — B3731 Acute candidiasis of vulva and vagina: Secondary | ICD-10-CM

## 2020-12-18 DIAGNOSIS — B373 Candidiasis of vulva and vagina: Secondary | ICD-10-CM | POA: Diagnosis not present

## 2020-12-18 MED ORDER — FLUCONAZOLE 150 MG PO TABS
150.0000 mg | ORAL_TABLET | ORAL | 0 refills | Status: DC
Start: 2020-12-18 — End: 2021-02-27

## 2020-12-18 NOTE — Progress Notes (Signed)
HPI:      [name] is a 35 y.o. [phone] who LMP was Patient's last menstrual period was 12/02/2020 (exact date).  Subjective:   She presents today complaining of a 1 week history of external itching of the labia.  She states that she has had yeast infections before but usually they are "inside".  She reports that she has also noticed what appears to be a rash. She denies new sexual partners.  Not concerned regarding STDs.    Hx: The following portions of the patient's history were reviewed and updated as appropriate:             She  has a past medical history of Miscarriage. She does not have any pertinent problems on file. She  has a past surgical history that includes No past surgeries. Her family history includes Diabetes in her father; Healthy in her mother. She  reports that she has never smoked. She has never used smokeless tobacco. She reports current alcohol use. She reports that she does not use drugs. She has a current medication list which includes the following prescription(s): apple cider vinegar, cetirizine, cod liver oil, fluconazole, and multivitamin-prenatal. She has No Known Allergies.       Review of Systems:  Review of Systems  Constitutional: Denied constitutional symptoms, night sweats, recent illness, fatigue, fever, insomnia and weight loss.  Eyes: Denied eye symptoms, eye pain, photophobia, vision change and visual disturbance.  Ears/Nose/Throat/Neck: Denied ear, nose, throat or neck symptoms, hearing loss, nasal discharge, sinus congestion and sore throat.  Cardiovascular: Denied cardiovascular symptoms, arrhythmia, chest pain/pressure, edema, exercise intolerance, orthopnea and palpitations.  Respiratory: Denied pulmonary symptoms, asthma, pleuritic pain, productive sputum, cough, dyspnea and wheezing.  Gastrointestinal: Denied, gastro-esophageal reflux, melena, nausea and vomiting.  Genitourinary: See HPI for additional information  Musculoskeletal:  Denied musculoskeletal symptoms, stiffness, swelling, muscle weakness and myalgia.  Dermatologic: Denied dermatology symptoms, rash and scar.  Neurologic: Denied neurology symptoms, dizziness, headache, neck pain and syncope.  Psychiatric: Denied psychiatric symptoms, anxiety and depression.  Endocrine: Denied endocrine symptoms including hot flashes and night sweats.   Meds:   Current Outpatient Medications on File Prior to Visit  Medication Sig Dispense Refill   APPLE CIDER VINEGAR PO Take by mouth. liquid     cetirizine (ZYRTEC) 10 MG tablet Take by mouth.     Cod Liver Oil 10 MINIM CAPS Take by mouth.     Prenatal Vit-Fe Fumarate-FA (MULTIVITAMIN-PRENATAL) 27-0.8 MG TABS tablet Take 1 tablet by mouth daily at 12 noon.     No current facility-administered medications on file prior to visit.          Objective:     Vitals:   12/18/20 0956  BP: (!) 143/86  Pulse: 84  Resp: 16   Filed Weights   12/18/20 0956  Weight: 226 lb 8 oz (102.7 kg)              Physical examination   Pelvic:   Vulva: Excoriated and irritated appearing.  Vagina: No lesions or abnormalities noted.  Support: Normal pelvic support.  Urethra No masses tenderness or scarring.  Meatus Normal size without lesions or prolapse.  Cervix: Normal appearance.  No lesions.  Anus: Normal exam.  No lesions.  Perineum: Normal exam.  No lesions.     Assessment:    [id number] Patient Active Problem List   Diagnosis Date Noted   H/O fibromyalgia 12/14/2019   GBS (group B Streptococcus carrier), +RV culture, currently pregnant 06/17/2012  Supervision of normal first pregnancy 06/11/2012   Carpal tunnel syndrome, bilateral 05/26/2012   Monilial vaginitis 05/26/2012   Family history of thyroid disorder 01/03/2012     1. Monilial vulvovaginitis   2. Rash        Plan:            1.  Nuswab performed  2.  Strongly suspect monilia vulvovaginitis-use of topical antifungals and 2 weeks of  Diflucan. Orders No orders of the defined types were placed in this encounter.    Meds ordered this encounter  Medications   fluconazole (DIFLUCAN) 150 MG tablet    Sig: Take 1 tablet (150 mg total) by mouth every 3 (three) days. Tues Friday Friday as directed    Dispense:  3 tablet    Refill:  0      F/U  No follow-ups on file. I spent 22 minutes involved in the care of this patient preparing to see the patient by obtaining and reviewing her medical history (including labs, imaging tests and prior procedures), documenting clinical information in the electronic health record (EHR), counseling and coordinating care plans, writing and sending prescriptions, ordering tests or procedures and directly communicating with the patient by discussing pertinent items from her history and physical exam as well as detailing my assessment and plan as noted above so that she has an informed understanding.  All of her questions were answered.  Elonda Husky, M.D. 12/18/2020 10:43 AM    Her cellular and minor spine by

## 2020-12-19 LAB — CERVICOVAGINAL ANCILLARY ONLY
Bacterial Vaginitis (gardnerella): NEGATIVE
Candida Glabrata: NEGATIVE
Candida Vaginitis: POSITIVE — AB
Comment: NEGATIVE
Comment: NEGATIVE
Comment: NEGATIVE

## 2021-01-17 DIAGNOSIS — R69 Illness, unspecified: Secondary | ICD-10-CM | POA: Diagnosis not present

## 2021-02-27 ENCOUNTER — Encounter: Payer: Self-pay | Admitting: Obstetrics and Gynecology

## 2021-02-27 ENCOUNTER — Ambulatory Visit (INDEPENDENT_AMBULATORY_CARE_PROVIDER_SITE_OTHER): Payer: 59 | Admitting: Obstetrics and Gynecology

## 2021-02-27 ENCOUNTER — Other Ambulatory Visit: Payer: Self-pay

## 2021-02-27 VITALS — BP 132/84 | HR 91 | Ht 66.0 in | Wt 224.9 lb

## 2021-02-27 DIAGNOSIS — Z8759 Personal history of other complications of pregnancy, childbirth and the puerperium: Secondary | ICD-10-CM | POA: Diagnosis not present

## 2021-02-27 DIAGNOSIS — N926 Irregular menstruation, unspecified: Secondary | ICD-10-CM

## 2021-02-27 DIAGNOSIS — O210 Mild hyperemesis gravidarum: Secondary | ICD-10-CM

## 2021-02-27 LAB — POCT URINE PREGNANCY: Preg Test, Ur: POSITIVE — AB

## 2021-02-27 NOTE — Progress Notes (Signed)
Pt is present today for confirmation of pregnancy. Pt LMP 12/31/2020  . UPT done today results were positive. Pt stated that she is doing well no complaints.

## 2021-02-27 NOTE — Patient Instructions (Signed)
Nausea and Vomiting "Morning sickness" can occur at any time during the day and generally goes away by 16 weeks of pregnancy.  Have saltine crackers or pretzels by your bed and eat a few bites before you raise your head out of the bed in the morning.  Eat small frequent meals throughout the day instead of large meals.  Drink plenty of fluids throughout the day to stay hydrated, just don't drink a lot of fluids with your meals. This can make your stomach fill up faster making you feel sick.  Do not brush your teeth right after you eat.  Products with real ginger are good for nausea, like ginger ale and ginger hard candy. Make sure it says made with real ginger!  Sucking on sour candy like lemon heads is also good for nausea.  If your prenatal vitamins make you nauseated, take them at night so you will sleep through the nausea.  Sea bands  You can try over the counter Vitamin B6 (pyridoxine) 25 mg four times a day OR 50 mg twice a day. Add Unsiom (doxylamine) 12.5 mg (1/2 tab) in the morning, 12.48m 6-8 hours later, then 25 mg every night if symptoms do not improve with the Vitamin B6 alone.  If you feel like you need prescription medicine for nausea and vomiting please let [facility].  If you are unable to keep any fluids or food down please let [facility].  If you are vomiting more than 6 times a day please let [facility].  If you are showing any signs of dehydration-dry cracked lips, decreased urination, dizziness please let [facility].  Severe headaches that don't go away with Tylenol.  Visual changes (seeing spots, double, blurred vision)  Hope this information is helpful. If you have any problems or concerns please feel free to contact the office at [phone] 24/7 by mychart.    Reminder mychart messages are only answered during office business hours Monday-Friday 8am-5pm.   If it is an emergency please contact the office or go the Labor and Delivery.   Thanks Sara Burch     Morning Sickness  Morning sickness is when you feel like you may vomit (feel nauseous) during pregnancy. Sometimes, you may vomit. Morning sickness most often happens in the morning, but it can also happen at any time of the day. Some women may have morning sickness that makes them vomit all the time. This is amore serious problem that needs treatment. What are the causes? The cause of this condition is not known. What increases the risk? You had vomiting or a feeling like you may vomit before your pregnancy. You had morning sickness in another pregnancy. You are pregnant with more than one baby, such as twins. What are the signs or symptoms? Feeling like you may vomit. Vomiting. How is this treated? Treatment is usually not needed for this condition. You may only need to change what you eat. In some cases, your doctor may give you some things to take for your condition. These include: Vitamin B6 supplements. Medicines to treat the feeling that you may vomit. Ginger. Follow these instructions at home: Medicines Take over-the-counter and prescription medicines only as told by your doctor. Do not take any medicines until you talk with your doctor about them first. Take multivitamins before you get pregnant. These can stop or lessen the symptoms of morning sickness. Eating and drinking Eat dry toast or crackers before getting out of bed. Eat 5 or 6 small meals a  day. Eat dry and bland foods like rice and baked potatoes. Do not eat greasy, fatty, or spicy foods. Have someone cook for you if the smell of food causes you to vomit or to feel like you may vomit. If you feel like you may vomit after taking prenatal vitamins, take them at night or with a snack. Eat protein foods when you need a snack. Nuts, yogurt, and cheese are good choices. Drink fluids throughout the day. Try ginger ale made with real ginger, ginger tea made from fresh grated ginger, or ginger candies. General  instructions Do not smoke or use any products that contain nicotine or tobacco. If you need help quitting, ask your doctor. Use an air purifier to keep the air in your house free of smells. Get lots of fresh air. Try to avoid smells that make you feel sick. Try wearing an acupressure wristband. This is a wristband that is used to treat seasickness. Try a treatment called acupuncture. In this treatment, a doctor puts needles into certain areas of your body to make you feel better. Contact a doctor if: You need medicine to feel better. You feel dizzy or light-headed. You are losing weight. Get help right away if: The feeling that you may vomit will not go away, or you cannot stop vomiting. You faint. You have very bad pain in your belly. Summary Morning sickness is when you feel like you may vomit (feel nauseous) during pregnancy. You may feel sick in the morning, but you can feel this way at any time of the day. Making some changes to what you eat may help your symptoms go away. This information is not intended to replace advice given to you by your health care provider. Make sure you discuss any questions you have with your healthcare provider. Document Revised: 02/06/2020 Document Reviewed: 01/16/2020 Elsevier Patient Education  [place]. Common Medications Safe in Pregnancy  Acne:      Constipation:  Benzoyl Peroxide     Colace  Clindamycin      Dulcolax Suppository  Topica Erythromycin     Fibercon  Salicylic Acid      Metamucil         Miralax AVOID:        Senakot   Accutane    Cough:  Retin-A       Cough Drops  Tetracycline      Phenergan w/ Codeine if Rx  Minocycline      Robitussin (Plain & DM)  Antibiotics:     Crabs/Lice:  Ceclor       RID  Cephalosporins    AVOID:  E-Mycins      Kwell  Keflex  Macrobid/Macrodantin   Diarrhea:  Penicillin      Kao-Pectate  Zithromax      Imodium AD         PUSH  FLUIDS AVOID:       Cipro     Fever:  Tetracycline      Tylenol (Regular or Extra  Minocycline       Strength)  Levaquin      Extra Strength-Do not          Exceed 8 tabs/24 hrs Caffeine:        <255m/day (equiv. To 1 cup of coffee or  approx. 3 12 oz sodas)         Gas: Cold/Hayfever:       Gas-X  Benadryl      Mylicon  Claritin  Phazyme  **Claritin-D        Chlor-Trimeton    Headaches:  Dimetapp      ASA-Free Excedrin  Drixoral-Non-Drowsy     Cold Compress  Mucinex (Guaifenasin)     Tylenol (Regular or Extra  Sudafed/Sudafed-12 Hour     Strength)  **Sudafed PE Pseudoephedrine   Tylenol Cold & Sinus     Vicks Vapor Rub  Zyrtec  **AVOID if Problems With Blood Pressure         Heartburn: Avoid lying down for at least 1 hour after meals  Aciphex      Maalox     Rash:  Milk of Magnesia     Benadryl    Mylanta       1% Hydrocortisone Cream  Pepcid  Pepcid Complete   Sleep Aids:  Prevacid      Ambien   Prilosec       Benadryl  Rolaids       Chamomile Tea  Tums (Limit 4/day)     Unisom         Tylenol PM         Warm milk-add vanilla or  Hemorrhoids:       Sugar for taste  Anusol/Anusol H.C.  (RX: Analapram 2.5%)  Sugar Substitutes:  Hydrocortisone OTC     Ok in moderation  Preparation H      Tucks        Vaseline lotion applied to tissue with wiping    Herpes:     Throat:  Acyclovir      Oragel  Famvir  Valtrex     Vaccines:         Flu Shot Leg Cramps:       *Gardasil  Benadryl      Hepatitis A         Hepatitis B Nasal Spray:       Pneumovax  Saline Nasal Spray     Polio Booster         Tetanus Nausea:       Tuberculosis test or PPD  Vitamin B6 25 mg TID   AVOID:    Dramamine      *Gardasil  Emetrol       Live Poliovirus  Ginger Root 250 mg QID    MMR (measles, mumps &  High Complex Carbs @ Bedtime    rebella)  Sea Bands-Accupressure    Varicella (Chickenpox)  Unisom 1/2 tab TID     *No known complications           If received  before Pain:         Known pregnancy;   Darvocet       Resume series after  Lortab        Delivery  Percocet    Yeast:   Tramadol      Femstat  Tylenol 3      Gyne-lotrimin  Ultram       Monistat  Vicodin           MISC:         All Sunscreens           Hair Coloring/highlights          Insect Repellant's          (Including DEET)         Mystic Tans

## 2021-02-27 NOTE — Progress Notes (Signed)
HPI:      [name] is a 35 y.o. [phone] who LMP was Patient's last menstrual period was 12/31/2020 (exact date).  Subjective:   She presents today after missing her menstrual period.  She took a home pregnancy test and was noted to be positive.  She definitely feels pregnant and has some daily nausea.  She has had no bleeding in the first trimester other than right around the time of her missed menses she had a small amount of spotting. Of significant note she has had 2 prior miscarriages in a row.  She has had a previous birth. She and her husband are excited about being pregnant. She has occasional nausea but is not vomiting.    Hx: The following portions of the patient's history were reviewed and updated as appropriate:             She  has a past medical history of Miscarriage. She does not have any pertinent problems on file. She  has a past surgical history that includes No past surgeries. Her family history includes Diabetes in her father; Healthy in her mother. She  reports that she has never smoked. She has never used smokeless tobacco. She reports that she does not currently use alcohol. She reports that she does not use drugs. She has a current medication list which includes the following prescription(s): apple cider vinegar, cetirizine, cod liver oil, and multivitamin-prenatal. She has No Known Allergies.       Review of Systems:  Review of Systems  Constitutional: Denied constitutional symptoms, night sweats, recent illness, fatigue, fever, insomnia and weight loss.  Eyes: Denied eye symptoms, eye pain, photophobia, vision change and visual disturbance.  Ears/Nose/Throat/Neck: Denied ear, nose, throat or neck symptoms, hearing loss, nasal discharge, sinus congestion and sore throat.  Cardiovascular: Denied cardiovascular symptoms, arrhythmia, chest pain/pressure, edema, exercise intolerance, orthopnea and palpitations.  Respiratory: Denied pulmonary symptoms, asthma,  pleuritic pain, productive sputum, cough, dyspnea and wheezing.  Gastrointestinal: Denied, gastro-esophageal reflux, melena, nausea and vomiting.  Genitourinary: Denied genitourinary symptoms including symptomatic vaginal discharge, pelvic relaxation issues, and urinary complaints.  Musculoskeletal: Denied musculoskeletal symptoms, stiffness, swelling, muscle weakness and myalgia.  Dermatologic: Denied dermatology symptoms, rash and scar.  Neurologic: Denied neurology symptoms, dizziness, headache, neck pain and syncope.  Psychiatric: Denied psychiatric symptoms, anxiety and depression.  Endocrine: Denied endocrine symptoms including hot flashes and night sweats.   Meds:   Current Outpatient Medications on File Prior to Visit  Medication Sig Dispense Refill   APPLE CIDER VINEGAR PO Take by mouth. liquid     cetirizine (ZYRTEC) 10 MG tablet Take by mouth.     Cod Liver Oil 10 MINIM CAPS Take by mouth.     Prenatal Vit-Fe Fumarate-FA (MULTIVITAMIN-PRENATAL) 27-0.8 MG TABS tablet Take 1 tablet by mouth daily at 12 noon.     No current facility-administered medications on file prior to visit.      Objective:     Vitals:   02/27/21 1506  BP: 132/84  Pulse: 91   Filed Weights   02/27/21 1506  Weight: 224 lb 14.4 oz (102 kg)              urinary pregnancy test positive.          Assessment:    [id number] Patient Active Problem List   Diagnosis Date Noted   H/O fibromyalgia 12/14/2019   GBS (group B Streptococcus carrier), +RV culture, currently pregnant 06/17/2012   Supervision of normal first pregnancy 06/11/2012  Carpal tunnel syndrome, bilateral 05/26/2012   Monilial vaginitis 05/26/2012   Family history of thyroid disorder 01/03/2012     1. Missed period   2. History of miscarriage   3. Morning sickness     Patient feels much different this pregnancy.  Excited about being pregnant.  No first trimester bleeding.   Plan:            1.  Prenatal Plan 1.  The  patient was given prenatal literature. 2.  She was continued on prenatal vitamins. 3.  A prenatal lab panel to be drawn at nurse visit. 4.  An ultrasound was ordered to better determine an EDC. 5.  A nurse visit was scheduled. 6.  Genetic testing and testing for other inheritable conditions discussed in detail. She will decide in the future whether to have these labs performed. 7.  A general overview of pregnancy testing, visit schedule, ultrasound schedule, and prenatal care was discussed. 8.  COVID and its risks associated with pregnancy, prevention by limiting exposure and use of masks, as well as the risks and benefits of vaccination during pregnancy were discussed in detail.  [facility] policy regarding office and hospital visitation and testing was explained. 9.  Benefits of breast-feeding discussed in detail including both maternal and infant benefits. Ready Set Baby website discussed. 10.  Discussed vaginal progesterone suppositories risks and benefits reviewed-patient and partner declined 11.  Discussed nausea and vomiting in pregnancy. 12.  Likely begin baby aspirin at 13 weeks.  Orders Orders Placed This Encounter  Procedures   US OB Comp Less 14 Wks   POCT urine pregnancy    No orders of the defined types were placed in this encounter.     F/U  Return in about 5 weeks (around 04/03/2021). I spent 25 minutes involved in the care of this patient preparing to see the patient by obtaining and reviewing her medical history (including labs, imaging tests and prior procedures), documenting clinical information in the electronic health record (EHR), counseling and coordinating care plans, writing and sending prescriptions, ordering tests or procedures and in direct communicating with the patient and medical staff discussing pertinent items from her history and physical exam.  Elonda Husky, M.D. 02/27/2021 3:51 PM

## 2021-03-01 ENCOUNTER — Inpatient Hospital Stay: Admit: 2021-03-01 | Payer: Self-pay

## 2021-03-13 ENCOUNTER — Other Ambulatory Visit: Payer: Self-pay | Admitting: Obstetrics and Gynecology

## 2021-03-13 DIAGNOSIS — Z3491 Encounter for supervision of normal pregnancy, unspecified, first trimester: Secondary | ICD-10-CM

## 2021-03-13 DIAGNOSIS — Z8759 Personal history of other complications of pregnancy, childbirth and the puerperium: Secondary | ICD-10-CM

## 2021-03-13 DIAGNOSIS — N926 Irregular menstruation, unspecified: Secondary | ICD-10-CM

## 2021-03-14 ENCOUNTER — Other Ambulatory Visit: Payer: Self-pay | Admitting: Obstetrics and Gynecology

## 2021-03-14 ENCOUNTER — Ambulatory Visit
Admission: RE | Admit: 2021-03-14 | Discharge: 2021-03-14 | Disposition: A | Discharge status: Home / Self Care | Payer: 59 | Source: Ambulatory Visit | Attending: Obstetrics and Gynecology | Admitting: Obstetrics and Gynecology

## 2021-03-14 ENCOUNTER — Other Ambulatory Visit: Payer: Self-pay

## 2021-03-14 DIAGNOSIS — Z3491 Encounter for supervision of normal pregnancy, unspecified, first trimester: Secondary | ICD-10-CM

## 2021-03-14 DIAGNOSIS — Z8759 Personal history of other complications of pregnancy, childbirth and the puerperium: Secondary | ICD-10-CM | POA: Insufficient documentation

## 2021-03-14 DIAGNOSIS — N926 Irregular menstruation, unspecified: Secondary | ICD-10-CM | POA: Insufficient documentation

## 2021-03-14 DIAGNOSIS — O3680X0 Pregnancy with inconclusive fetal viability, not applicable or unspecified: Secondary | ICD-10-CM | POA: Diagnosis not present

## 2021-03-14 DIAGNOSIS — Z3A09 9 weeks gestation of pregnancy: Secondary | ICD-10-CM | POA: Diagnosis not present

## 2021-03-25 ENCOUNTER — Ambulatory Visit (INDEPENDENT_AMBULATORY_CARE_PROVIDER_SITE_OTHER): Payer: 59 | Admitting: Obstetrics and Gynecology

## 2021-03-25 ENCOUNTER — Other Ambulatory Visit: Payer: Self-pay

## 2021-03-25 VITALS — BP 124/84 | HR 86 | Resp 16 | Ht 66.0 in | Wt 218.2 lb

## 2021-03-25 DIAGNOSIS — O99210 Obesity complicating pregnancy, unspecified trimester: Secondary | ICD-10-CM | POA: Diagnosis not present

## 2021-03-25 DIAGNOSIS — Z113 Encounter for screening for infections with a predominantly sexual mode of transmission: Secondary | ICD-10-CM | POA: Diagnosis not present

## 2021-03-25 DIAGNOSIS — R69 Illness, unspecified: Secondary | ICD-10-CM | POA: Diagnosis not present

## 2021-03-25 DIAGNOSIS — Z3401 Encounter for supervision of normal first pregnancy, first trimester: Secondary | ICD-10-CM

## 2021-03-25 DIAGNOSIS — Z3A12 12 weeks gestation of pregnancy: Secondary | ICD-10-CM | POA: Diagnosis not present

## 2021-03-25 NOTE — Progress Notes (Signed)
Sara Burch presents for NOB nurse interview visit. Pregnancy confirmation done 02/27/2021.  [id number].  Pregnancy education material explained and given. No cats in the home. NOB labs ordered. TSH/HbgA1c due to Increased BMI. Body mass index is 35.22 kg/m. Sickle cell, HIV labs and Drug screen were explained optional and she did not decline. Drug screen ordered/declined. PNV encouraged. Genetic screening options discussed. Genetic testing: Ordered.  Pt may discuss with provider.  Financial policy reviewed. FMLA form reviewed and signed. Pt. To follow up with provider in 2 weeks for NOB physical.  All questions answered.

## 2021-03-25 NOTE — Patient Instructions (Signed)
Commonly Asked Questions During Pregnancy  Cats: A parasite can be excreted in cat feces.  To avoid exposure you need to have another person empty the little box.  If you must empty the litter box you will need to wear gloves.  Wash your hands after handling your cat.  This parasite can also be found in raw or undercooked meat so this should also be avoided.  Colds, Sore Throats, Flu: Please check your medication sheet to see what you can take for symptoms.  If your symptoms are unrelieved by these medications please call the office.  Dental Work: Most any dental work your dentist recommends is permitted.  X-rays should only be taken during the first trimester if absolutely necessary.  Your abdomen should be shielded with a lead apron during all x-rays.  Please notify your provider prior to receiving any x-rays.  Novocaine is fine; gas is not recommended.  If your dentist requires a note from us prior to dental work please call the office and we will provide one for you.  Exercise: Exercise is an important part of staying healthy during your pregnancy.  You may continue most exercises you were accustomed to prior to pregnancy.  Later in your pregnancy you will most likely notice you have difficulty with activities requiring balance like riding a bicycle.  It is important that you listen to your body and avoid activities that put you at a higher risk of falling.  Adequate rest and staying well hydrated are a must!  If you have questions about the safety of specific activities ask your provider.    Exposure to Children with illness: Try to avoid obvious exposure; report any symptoms to us when noted,  If you have chicken pos, red measles or mumps, you should be immune to these diseases.   Please do not take any vaccines while pregnant unless you have checked with your OB provider.  Fetal Movement: After 28 weeks we recommend you do "kick counts" twice daily.  Lie or sit down in a calm quiet environment and  count your baby movements "kicks".  You should feel your baby at least 10 times per hour.  If you have not felt 10 kicks within the first hour get up, walk around and have something sweet to eat or drink then repeat for an additional hour.  If count remains less than 10 per hour notify your provider.  Fumigating: Follow your pest control agent's advice as to how long to stay out of your home.  Ventilate the area well before re-entering.  Hemorrhoids:   Most over-the-counter preparations can be used during pregnancy.  Check your medication to see what is safe to use.  It is important to use a stool softener or fiber in your diet and to drink lots of liquids.  If hemorrhoids seem to be getting worse please call the office.   Hot Tubs:  Hot tubs Jacuzzis and saunas are not recommended while pregnant.  These increase your internal body temperature and should be avoided.  Intercourse:  Sexual intercourse is safe during pregnancy as long as you are comfortable, unless otherwise advised by your provider.  Spotting may occur after intercourse; report any bright red bleeding that is heavier than spotting.  Labor:  If you know that you are in labor, please go to the hospital.  If you are unsure, please call the office and let us help you decide what to do.  Lifting, straining, etc:  If your job requires heavy   lifting or straining please check with your provider for any limitations.  Generally, you should not lift items heavier than that you can lift simply with your hands and arms (no back muscles)  Painting:  Paint fumes do not harm your pregnancy, but may make you ill and should be avoided if possible.  Latex or water based paints have less odor than oils.  Use adequate ventilation while painting.  Permanents & Hair Color:  Chemicals in hair dyes are not recommended as they cause increase hair dryness which can increase hair loss during pregnancy.  " Highlighting" and permanents are allowed.  Dye may be  absorbed differently and permanents may not hold as well during pregnancy.  Sunbathing:  Use a sunscreen, as skin burns easily during pregnancy.  Drink plenty of fluids; avoid over heating.  Tanning Beds:  Because their possible side effects are still unknown, tanning beds are not recommended.  Ultrasound Scans:  Routine ultrasounds are performed at approximately 20 weeks.  You will be able to see your baby's general anatomy an if you would like to know the gender this can usually be determined as well.  If it is questionable when you conceived you may also receive an ultrasound early in your pregnancy for dating purposes.  Otherwise ultrasound exams are not routinely performed unless there is a medical necessity.  Although you can request a scan we ask that you pay for it when conducted because insurance does not cover " patient request" scans.  Work: If your pregnancy proceeds without complications you may work until your due date, unless your physician or employer advises otherwise.  Round Ligament Pain/Pelvic Discomfort:  Sharp, shooting pains not associated with bleeding are fairly common, usually occurring in the second trimester of pregnancy.  They tend to be worse when standing up or when you remain standing for long periods of time.  These are the result of pressure of certain pelvic ligaments called "round ligaments".  Rest, Tylenol and heat seem to be the most effective relief.  As the womb and fetus grow, they rise out of the pelvis and the discomfort improves.  Please notify the office if your pain seems different than that described.  It may represent a more serious condition.  First Trimester of Pregnancy The first trimester of pregnancy starts on the first day of your last menstrual period until the end of week 12. This is also called months 1 through 3 of pregnancy. Body changes during your first trimester Your body goes through many changes during pregnancy. The changes usually  return to normal after your baby is born. Physical changes You may gain or lose weight. Your breasts may grow larger and hurt. The area around your nipples may get darker. Dark spots or blotches may develop on your face. You may have changes in your hair. Health changes You may feel like you might vomit (nauseous), and you may vomit. You may have heartburn. You may have headaches. You may have trouble pooping (constipation). Your gums may bleed. Other changes You may get tired easily. You may pee (urinate) more often. Your menstrual periods will stop. You may not feel hungry. You may want to eat certain kinds of food. You may have changes in your emotions from day to day. You may have more dreams. Follow these instructions at home: Medicines Take over-the-counter and prescription medicines only as told by your doctor. Some medicines are not safe during pregnancy. Take a prenatal vitamin that contains at least 600 micrograms (  mcg) of folic acid. °Eating and drinking °Eat healthy meals that include: °Fresh fruits and vegetables. °Whole grains. °Good sources of protein, such as meat, eggs, or tofu. °Low-fat dairy products. °Avoid raw meat and unpasteurized juice, milk, and cheese. °If you feel like you may vomit, or you vomit: °Eat 4 or 5 small meals a day instead of 3 large meals. °Try eating a few soda crackers. °Drink liquids between meals instead of during meals. °You may need to take these actions to prevent or treat trouble pooping: °Drink enough fluids to keep your pee (urine) pale yellow. °Eat foods that are high in fiber. These include beans, whole grains, and fresh fruits and vegetables. °Limit foods that are high in fat and sugar. These include fried or sweet foods. °Activity °Exercise only as told by your doctor. Most people can do their usual exercise routine during pregnancy. °Stop exercising if you have cramps or pain in your lower belly (abdomen) or low back. °Do not exercise if  it is too hot or too humid, or if you are in a place of great height (high altitude). °Avoid heavy lifting. °If you choose to, you may have sex unless your doctor tells you not to. °Relieving pain and discomfort °Wear a good support bra if your breasts are sore. °Rest with your legs raised (elevated) if you have leg cramps or low back pain. °If you have bulging veins (varicose veins) in your legs: °Wear support hose as told by your doctor. °Raise your feet for 15 minutes, 3-4 times a day. °Limit salt in your food. °Safety °Wear your seat belt at all times when you are in a car. °Talk with your doctor if someone is hurting you or yelling at you. °Talk with your doctor if you are feeling sad or have thoughts of hurting yourself. °Lifestyle °Do not use hot tubs, steam rooms, or saunas. °Do not douche. Do not use tampons or scented sanitary pads. °Do not use herbal medicines, illegal drugs, or medicines that are not approved by your doctor. Do not drink alcohol. °Do not smoke or use any products that contain nicotine or tobacco. If you need help quitting, ask your doctor. °Avoid cat litter boxes and soil that is used by cats. These carry germs that can cause harm to the baby and can cause a loss of your baby by miscarriage or stillbirth. °General instructions °Keep all follow-up visits. This is important. °Ask for help if you need counseling or if you need help with nutrition. Your doctor can give you advice or tell you where to go for help. °Visit your dentist. At home, brush your teeth with a soft toothbrush. Floss gently. °Write down your questions. Take them to your prenatal visits. °Where to find more information °American Pregnancy Association: americanpregnancy.org °American College of Obstetricians and Gynecologists: www.acog.org °Office on Women's Health: womenshealth.gov/pregnancy °Contact a doctor if: °You are dizzy. °You have a fever. °You have mild cramps or pressure in your lower belly. °You have a nagging  pain in your belly area. °You continue to feel like you may vomit, you vomit, or you have watery poop (diarrhea) for 24 hours or longer. °You have a bad-smelling fluid coming from your vagina. °You have pain when you pee. °You are exposed to a disease that spreads from person to person, such as chickenpox, measles, Zika virus, HIV, or hepatitis. °Get help right away if: °You have spotting or bleeding from your vagina. °You have very bad belly cramping or pain. °You have shortness   of breath or chest pain. You have any kind of injury, such as from a fall or a car crash. You have new or increased pain, swelling, or redness in an arm or leg. Summary The first trimester of pregnancy starts on the first day of your last menstrual period until the end of week 12 (months 1 through 3). Eat 4 or 5 small meals a day instead of 3 large meals. Do not smoke or use any products that contain nicotine or tobacco. If you need help quitting, ask your doctor. Keep all follow-up visits. This information is not intended to replace advice given to you by your health care provider. Make sure you discuss any questions you have with your health care provider. Document Revised: 11/30/2019 Document Reviewed: 10/06/2019 Elsevier Patient Education  [address] [place] Class  December 17, 2016  Wednesday 7:00p - 9:00p  [facility] [place], [place]  January 21, 2017  Wednesday 7:00p - 9:00p [facility] [place], [place]    February 25, 2017   Wednesday 7:00p - 9:000p [facility] [place], [place]  March 25, 2017  Wednesday  7:00p - 9:00p [facility] [place], [place]  April 22, 2017 Wednesday 7:00p - 9:00p [facility] [place], [place]  Interested in a waterbirth?  This informational class will help you discover whether waterbirth is the right fit for you.  Education about waterbirth itself, supplies you would need  and how to assemble your support team is what you can expect from this class.  Some obstetrical practices require this class in order to pursue a waterbirth.  (Not all obstetrical practices offer waterbirth check with your healthcare provider)  Register only the expectant mom, but you are encouraged to bring your partner to class!  Fees & Payment No fee  Register Online www.[employer].se Search Waterbirth Morning Sickness Morning sickness is when you feel like you may vomit (feel nauseous) during pregnancy. Sometimes, you may vomit. Morning sickness most often happens in the morning, but it can also happen at any time of the day. Some women may have morning sickness that makes them vomit all the time. This is a more serious problem that needs treatment. What are the causes? The cause of this condition is not known. What increases the risk? You had vomiting or a feeling like you may vomit before your pregnancy. You had morning sickness in another pregnancy. You are pregnant with more than one baby, such as twins. What are the signs or symptoms? Feeling like you may vomit. Vomiting. How is this treated? Treatment is usually not needed for this condition. You may only need to change what you eat. In some cases, your doctor may give you some things to take for your condition. These include: Vitamin B6 supplements. Medicines to treat the feeling that you may vomit. Ginger. Follow these instructions at home: Medicines Take over-the-counter and prescription medicines only as told by your doctor. Do not take any medicines until you talk with your doctor about them first. Take multivitamins before you get pregnant. These can stop or lessen the symptoms of morning sickness. Eating and drinking Eat dry toast or crackers before getting out of bed. Eat 5 or 6 small meals a day. Eat dry and bland foods like rice and baked potatoes. Do not eat greasy, fatty, or spicy foods. Have  someone cook for you if the smell of food causes you to vomit or to feel like you may vomit. If you  feel like you may vomit after taking prenatal vitamins, take them at night or with a snack. Eat protein foods when you need a snack. Nuts, yogurt, and cheese are good choices. Drink fluids throughout the day. Try ginger ale made with real ginger, ginger tea made from fresh grated ginger, or ginger candies. General instructions Do not smoke or use any products that contain nicotine or tobacco. If you need help quitting, ask your doctor. Use an air purifier to keep the air in your house free of smells. Get lots of fresh air. Try to avoid smells that make you feel sick. Try wearing an acupressure wristband. This is a wristband that is used to treat seasickness. Try a treatment called acupuncture. In this treatment, a doctor puts needles into certain areas of your body to make you feel better. Contact a doctor if: You need medicine to feel better. You feel dizzy or light-headed. You are losing weight. Get help right away if: The feeling that you may vomit will not go away, or you cannot stop vomiting. You faint. You have very bad pain in your belly. Summary Morning sickness is when you feel like you may vomit (feel nauseous) during pregnancy. You may feel sick in the morning, but you can feel this way at any time of the day. Making some changes to what you eat may help your symptoms go away. This information is not intended to replace advice given to you by your health care provider. Make sure you discuss any questions you have with your health care provider. Document Revised: 02/06/2020 Document Reviewed: 01/16/2020 Elsevier Patient Education  2022 [employer].

## 2021-03-26 LAB — VARICELLA ZOSTER ANTIBODY, IGG: Varicella zoster IgG: 1400 index (ref >165)

## 2021-03-26 LAB — PAIN MGT SCRN (14 DRUGS), UR
Amphetamine Scrn, Ur: NEGATIVE ng/mL
BARBITURATE SCREEN URINE: NEGATIVE ng/mL
BENZODIAZEPINE SCREEN, URINE: NEGATIVE ng/mL
Buprenorphine, Urine: NEGATIVE ng/mL
CANNABINOIDS UR QL SCN: NEGATIVE ng/mL
Cocaine (Metab) Scrn, Ur: NEGATIVE ng/mL
Creatinine(Crt), U: 200.8 mg/dL (ref 20.0–300.0)
Fentanyl, Urine: NEGATIVE pg/mL
Meperidine Screen, Urine: NEGATIVE ng/mL
Methadone Screen, Urine: NEGATIVE ng/mL
OXYCODONE+OXYMORPHONE UR QL SCN: NEGATIVE ng/mL
Opiate Scrn, Ur: NEGATIVE ng/mL
Ph of Urine: 6.2 (ref 4.5–8.9)
Phencyclidine Qn, Ur: NEGATIVE ng/mL
Propoxyphene Scrn, Ur: NEGATIVE ng/mL
Tramadol Screen, Urine: NEGATIVE ng/mL

## 2021-03-26 LAB — RUBELLA SCREEN: Rubella Antibodies, IGG: 8.94 index (ref >0.99)

## 2021-03-26 LAB — HEMOGLOBIN A1C
Est. average glucose Bld gHb Est-mCnc: 120 mg/dL
Hgb A1c MFr Bld: 5.8 % — ABNORMAL HIGH (ref 4.8–5.6)

## 2021-03-26 LAB — PARVOVIRUS B19 ANTIBODY, IGG AND IGM
Parvovirus B19 IgG: 7.5 index — ABNORMAL HIGH (ref 0.0–0.8)
Parvovirus B19 IgM: 0.3 index (ref 0.0–0.8)

## 2021-03-26 LAB — ANTIBODY SCREEN: Antibody Screen: NEGATIVE

## 2021-03-26 LAB — URINALYSIS, ROUTINE W REFLEX MICROSCOPIC
Bilirubin, UA: NEGATIVE
Glucose, UA: NEGATIVE
Ketones, UA: NEGATIVE
Nitrite, UA: NEGATIVE
RBC, UA: NEGATIVE
Specific Gravity, UA: 1.021 (ref 1.005–1.030)
Urobilinogen, Ur: 0.2 mg/dL (ref 0.2–1.0)
pH, UA: 6.5 (ref 5.0–7.5)

## 2021-03-26 LAB — VIRAL HEPATITIS HBV, HCV
HCV Ab: <0.1 s/co ratio (ref 0.0–0.9)
Hep B Core Total Ab: NEGATIVE
Hep B Surface Ab, Qual: REACTIVE
Hepatitis B Surface Ag: NEGATIVE

## 2021-03-26 LAB — MICROSCOPIC EXAMINATION
Casts: NONE SEEN /lpf
Epithelial Cells (non renal): >10 /hpf — AB (ref 0–10)

## 2021-03-26 LAB — HIV ANTIBODY (ROUTINE TESTING W REFLEX): HIV Screen 4th Generation wRfx: NONREACTIVE

## 2021-03-26 LAB — RPR: RPR Ser Ql: NONREACTIVE

## 2021-03-26 LAB — HGB SOLU + RFLX FRAC: Sickle Solubility Test - HGBRFX: NEGATIVE

## 2021-03-26 LAB — ABO AND RH: Rh Factor: POSITIVE

## 2021-03-26 LAB — HCV INTERPRETATION

## 2021-03-26 LAB — TSH: TSH: 0.459 u[IU]/mL (ref 0.450–4.500)

## 2021-03-28 LAB — CULTURE, OB URINE

## 2021-03-28 LAB — URINE CULTURE, OB REFLEX

## 2021-03-28 LAB — GC/CHLAMYDIA PROBE AMP
Chlamydia trachomatis, NAA: NEGATIVE
Neisseria Gonorrhoeae by PCR: NEGATIVE

## 2021-04-08 NOTE — Patient Instructions (Signed)
Second Trimester of Pregnancy The second trimester of pregnancy is from week 13 through week 27. This is also called months 4 through 6 of pregnancy. This is often the time when you feel your best. During the second trimester: Morning sickness is less or has stopped. You may have more energy. You may feel hungry more often. At this time, your unborn baby (fetus) is growing very fast. At the end of the sixth month, the unborn baby may be up to 12 inches long and weigh about 1 pounds. You will likely start to feel the baby move between 16 and 20 weeks of pregnancy. Body changes during your second trimester Your body continues to go through many changes during this time. The changes vary and generally return to normal after the baby is born. Physical changes You will gain more weight. You may start to get stretch marks on your hips, belly (abdomen), and breasts. Your breasts will grow and may hurt. Dark spots or blotches may develop on your face. A dark line from your belly button to the pubic area (linea nigra) may appear. You may have changes in your hair. Health changes You may have headaches. You may have heartburn. You may have trouble pooping (constipation). You may have hemorrhoids or swollen, bulging veins (varicose veins). Your gums may bleed. You may pee (urinate) more often. You may have back pain. Follow these instructions at home: Medicines Take over-the-counter and prescription medicines only as told by your doctor. Some medicines are not safe during pregnancy. Take a prenatal vitamin that contains at least 600 micrograms (mcg) of folic acid. Eating and drinking Eat healthy meals that include: Fresh fruits and vegetables. Whole grains. Good sources of protein, such as meat, eggs, or tofu. Low-fat dairy products. Avoid raw meat and unpasteurized juice, milk, and cheese. You may need to take these actions to prevent or treat trouble pooping: Drink enough fluids to keep  your pee (urine) pale yellow. Eat foods that are high in fiber. These include beans, whole grains, and fresh fruits and vegetables. Limit foods that are high in fat and sugar. These include fried or sweet foods. Activity Exercise only as told by your doctor. Most people can do their usual exercise during pregnancy. Try to exercise for 30 minutes at least 5 days a week. Stop exercising if you have pain or cramps in your belly or lower back. Do not exercise if it is too hot or too humid, or if you are in a place of great height (high altitude). Avoid heavy lifting. If you choose to, you may have sex unless your doctor tells you not to. Relieving pain and discomfort Wear a good support bra if your breasts are sore. Take warm water baths (sitz baths) to soothe pain or discomfort caused by hemorrhoids. Use hemorrhoid cream if your doctor approves. Rest with your legs raised (elevated) if you have leg cramps or low back pain. If you develop bulging veins in your legs: Wear support hose as told by your doctor. Raise your feet for 15 minutes, 3-4 times a day. Limit salt in your food. Safety Wear your seat belt at all times when you are in a car. Talk with your doctor if someone is hurting you or yelling at you a lot. Lifestyle Do not use hot tubs, steam rooms, or saunas. Do not douche. Do not use tampons or scented sanitary pads. Avoid cat litter boxes and soil used by cats. These carry germs that can harm your baby and  can cause a loss of your baby by miscarriage or stillbirth. Do not use herbal medicines, illegal drugs, or medicines that are not approved by your doctor. Do not drink alcohol. Do not smoke or use any products that contain nicotine or tobacco. If you need help quitting, ask your doctor. General instructions Keep all follow-up visits. This is important. Ask your doctor about local prenatal classes. Ask your doctor about the right foods to eat or for help finding a  counselor. Where to find more information American Pregnancy Association: americanpregnancy.org [employer]nec[employer]: www.acog.org Office on [employer] Health: KeywordPortfol[employer].com.br Contact a doctor if: You have a headache that does not go away when you take medicine. You have changes in how you see, or you see spots in front of your eyes. You have mild cramps, pressure, or pain in your lower belly. You continue to feel like you may vomit (nauseous), you vomit, or you have watery poop (diarrhea). You have bad-smelling fluid coming from your vagina. You have pain when you pee or your pee smells bad. You have very bad swelling of your face, hands, ankles, feet, or legs. You have a fever. Get help right away if: You are leaking fluid from your vagina. You have spotting or bleeding from your vagina. You have very bad belly cramping or pain. You have trouble breathing. You have chest pain. You faint. You have not felt your baby move for the time period told by your doctor. You have new or increased pain, swelling, or redness in an arm or leg. Summary The second trimester of pregnancy is from week 13 through week 27 (months 4 through 6). Eat healthy meals. Exercise as told by your doctor. Most people can do their usual exercise during pregnancy. Do not use herbal medicines, illegal drugs, or medicines that are not approved by your doctor. Do not drink alcohol. Call your doctor if you get sick or if you notice anything unusual about your pregnancy. This information is not intended to replace advice given to you by your health care provider. Make sure you discuss any questions you have with your health care provider. Document Revised: 11/30/2019 Document Reviewed: 10/06/2019 Elsevier Patient Education  [place]. Common Medications Safe in Pregnancy  Acne:      Constipation:  Benzoyl Peroxide     Colace  Clindamycin      Dulcolax  Suppository  Topica Erythromycin     Fibercon  Salicylic Acid      Metamucil         Miralax AVOID:        Senakot   Accutane    Cough:  Retin-A       Cough Drops  Tetracycline      Phenergan w/ Codeine if Rx  Minocycline      Robitussin (Plain & DM)  Antibiotics:     Crabs/Lice:  Ceclor       RID  Cephalosporins    AVOID:  E-Mycins      Kwell  Keflex  Macrobid/Macrodantin   Diarrhea:  Penicillin      Kao-Pectate  Zithromax      Imodium AD         PUSH FLUIDS AVOID:       Cipro     Fever:  Tetracycline      Tylenol (Regular or Extra  Minocycline       Strength)  Levaquin      Extra Strength-Do not  Exceed 8 tabs/24 hrs Caffeine:        <200mg/day (equiv. To 1 cup of coffee or  approx. 3 12 oz sodas)         Gas: Cold/Hayfever:       Gas-X  Benadryl      Mylicon  Claritin       Phazyme  **Claritin-D        Chlor-Trimeton    Headaches:  Dimetapp      ASA-Free Excedrin  Drixoral-Non-Drowsy     Cold Compress  Mucinex (Guaifenasin)     Tylenol (Regular or Extra  Sudafed/Sudafed-12 Hour     Strength)  **Sudafed PE Pseudoephedrine   Tylenol Cold & Sinus     Vicks Vapor Rub  Zyrtec  **AVOID if Problems With Blood Pressure         Heartburn: Avoid lying down for at least 1 hour after meals  Aciphex      Maalox     Rash:  Milk of Magnesia     Benadryl    Mylanta       1% Hydrocortisone Cream  Pepcid  Pepcid Complete   Sleep Aids:  Prevacid      Ambien   Prilosec       Benadryl  Rolaids       Chamomile Tea  Tums (Limit 4/day)     Unisom         Tylenol PM         Warm milk-add vanilla or  Hemorrhoids:       Sugar for taste  Anusol/Anusol H.C.  (RX: Analapram 2.5%)  Sugar Substitutes:  Hydrocortisone OTC     Ok in moderation  Preparation H      Tucks        Vaseline lotion applied to tissue with wiping    Herpes:     Throat:  Acyclovir      Oragel  Famvir  Valtrex     Vaccines:         Flu Shot Leg  Cramps:       *Gardasil  Benadryl      Hepatitis A         Hepatitis B Nasal Spray:       Pneumovax  Saline Nasal Spray     Polio Booster         Tetanus Nausea:       Tuberculosis test or PPD  Vitamin B6 25 mg TID   AVOID:    Dramamine      *Gardasil  Emetrol       Live Poliovirus  Ginger Root 250 mg QID    MMR (measles, mumps &  High Complex Carbs @ Bedtime    rebella)  Sea Bands-Accupressure    Varicella (Chickenpox)  Unisom 1/2 tab TID     *No known complications           If received before Pain:         Known pregnancy;   Darvocet       Resume series after  Lortab        Delivery  Percocet    Yeast:   Tramadol      Femstat  Tylenol 3      Gyne-lotrimin  Ultram       Monistat  Vicodin           MISC:         All Sunscreens             Hair Coloring/highlights          Insect Repellant's          (Including DEET)         Mystic Tans  

## 2021-04-09 ENCOUNTER — Encounter: Payer: Self-pay | Admitting: Obstetrics and Gynecology

## 2021-04-09 ENCOUNTER — Ambulatory Visit (INDEPENDENT_AMBULATORY_CARE_PROVIDER_SITE_OTHER): Payer: 59 | Admitting: Obstetrics and Gynecology

## 2021-04-09 ENCOUNTER — Other Ambulatory Visit (HOSPITAL_COMMUNITY)
Admission: RE | Admit: 2021-04-09 | Discharge: 2021-04-09 | Disposition: A | Discharge status: Home / Self Care | Payer: 59 | Source: Ambulatory Visit | Attending: Obstetrics and Gynecology | Admitting: Obstetrics and Gynecology

## 2021-04-09 ENCOUNTER — Other Ambulatory Visit: Payer: Self-pay

## 2021-04-09 VITALS — BP 119/75 | HR 116 | Wt 218.5 lb

## 2021-04-09 DIAGNOSIS — Z124 Encounter for screening for malignant neoplasm of cervix: Secondary | ICD-10-CM | POA: Diagnosis not present

## 2021-04-09 DIAGNOSIS — Z3A14 14 weeks gestation of pregnancy: Secondary | ICD-10-CM

## 2021-04-09 DIAGNOSIS — Z3482 Encounter for supervision of other normal pregnancy, second trimester: Secondary | ICD-10-CM | POA: Diagnosis not present

## 2021-04-09 DIAGNOSIS — O210 Mild hyperemesis gravidarum: Secondary | ICD-10-CM

## 2021-04-09 LAB — POCT URINALYSIS DIPSTICK OB
Bilirubin, UA: NEGATIVE
Glucose, UA: NEGATIVE
Nitrite, UA: NEGATIVE
Spec Grav, UA: 1.015 (ref 1.010–1.025)
Urobilinogen, UA: 0.2 E.U./dL
pH, UA: 7.5 (ref 5.0–8.0)

## 2021-04-09 NOTE — Progress Notes (Signed)
NOB: Still having occasional nausea and vomiting but this is declining.  Pap smear performed.  ASA-81 recommended.  MaterniT 21 today.  Consider AFP with next visit.  Early 1 hour GCT with next visit.  Physical examination General NAD, Conversant  HEENT Atraumatic; Op clear with mmm.  Normo-cephalic. Pupils reactive. Anicteric sclerae  Thyroid/Neck Smooth without nodularity or enlargement. Normal ROM.  Neck Supple.  Skin No rashes, lesions or ulceration. Normal palpated skin turgor. No nodularity.  Breasts: No masses or discharge.  Symmetric.  No axillary adenopathy.  Lungs: Clear to auscultation.No rales or wheezes. Normal Respiratory effort, no retractions.  Heart: NSR.  No murmurs or rubs appreciated. No periferal edema  Abdomen: Soft.  Non-tender.  No masses.  No HSM. No hernia  Extremities: Moves all appropriately.  Normal ROM for age. No lymphadenopathy.  Neuro: Oriented to PPT.  Normal mood. Normal affect.     Pelvic:   Vulva: Normal appearance.  No lesions.  Vagina: No lesions or abnormalities noted.  Support: Normal pelvic support.  Urethra No masses tenderness or scarring.  Meatus Normal size without lesions or prolapse.  Cervix: Normal appearance.  No lesions.  Anus: Normal exam.  No lesions.  Perineum: Normal exam.  No lesions.        Bimanual   Adnexae: No masses.  Non-tender to palpation.  Uterus: Enlarged. 12wks  Pos FHTs  Non-tender.  Mobile.  AV.  Adnexae: No masses.  Non-tender to palpation.  Cul-de-sac: Negative for abnormality.  Adnexae: No masses.  Non-tender to palpation.         Pelvimetry   Diagonal: Reached.  Spines: Average.  Sacrum: Concave.  Pubic Arch: Normal.

## 2021-04-09 NOTE — Progress Notes (Signed)
OB-Pt present for initial prenatal care and exam. Pt c/o morning sickness. Pt stated that she is taking preggo pops and ginger with B6 to help with the morning sickness.  MaterniT21 ordered today.

## 2021-04-09 NOTE — Addendum Note (Signed)
Addended by: Silvano Bilis on: 04/09/2021 12:10 PM   Modules accepted: Orders

## 2021-04-10 LAB — CYTOLOGY - PAP
Adequacy: ABSENT
Diagnosis: NEGATIVE

## 2021-04-13 LAB — MATERNIT 21 PLUS CORE, BLOOD
Fetal Fraction: 6
Result (T21): NEGATIVE
Trisomy 13 (Patau syndrome): NEGATIVE
Trisomy 18 (Edwards syndrome): NEGATIVE
Trisomy 21 (Down syndrome): NEGATIVE

## 2021-05-08 ENCOUNTER — Other Ambulatory Visit: Payer: Self-pay

## 2021-05-08 ENCOUNTER — Encounter: Payer: Self-pay | Admitting: Obstetrics and Gynecology

## 2021-05-08 ENCOUNTER — Other Ambulatory Visit: Payer: 59

## 2021-05-08 ENCOUNTER — Ambulatory Visit (INDEPENDENT_AMBULATORY_CARE_PROVIDER_SITE_OTHER): Payer: 59 | Admitting: Obstetrics and Gynecology

## 2021-05-08 DIAGNOSIS — Z1379 Encounter for other screening for genetic and chromosomal anomalies: Secondary | ICD-10-CM

## 2021-05-08 DIAGNOSIS — O09522 Supervision of elderly multigravida, second trimester: Secondary | ICD-10-CM

## 2021-05-08 DIAGNOSIS — Z113 Encounter for screening for infections with a predominantly sexual mode of transmission: Secondary | ICD-10-CM

## 2021-05-08 DIAGNOSIS — R7303 Prediabetes: Secondary | ICD-10-CM | POA: Diagnosis not present

## 2021-05-08 DIAGNOSIS — Z23 Encounter for immunization: Secondary | ICD-10-CM

## 2021-05-08 DIAGNOSIS — G479 Sleep disorder, unspecified: Secondary | ICD-10-CM

## 2021-05-08 DIAGNOSIS — Z3A18 18 weeks gestation of pregnancy: Secondary | ICD-10-CM

## 2021-05-08 DIAGNOSIS — O219 Vomiting of pregnancy, unspecified: Secondary | ICD-10-CM

## 2021-05-08 DIAGNOSIS — Z3689 Encounter for other specified antenatal screening: Secondary | ICD-10-CM

## 2021-05-08 LAB — POCT URINALYSIS DIPSTICK OB
Bilirubin, UA: NEGATIVE
Glucose, UA: NEGATIVE
Ketones, UA: NEGATIVE
Nitrite, UA: NEGATIVE
Spec Grav, UA: 1.025 (ref 1.010–1.025)
Urobilinogen, UA: 0.2 E.U./dL
pH, UA: 6 (ref 5.0–8.0)

## 2021-05-08 NOTE — Progress Notes (Signed)
Notes difficulties with sleeping.  Wakes up in the middle of the night most nights and is awake for 3 hours before falling back asleep. Discussed natural sleep aids, cooling rooms.Also still having some mild nausea/vomiting.  Declines flu vaccine. RTC in 4 weeks. For anatomy scan in 2 weeks.

## 2021-05-08 NOTE — Progress Notes (Signed)
ROB: She is doing well, continue to have some nausea and vomiting. No new concerns. Declines flu vaccine.

## 2021-05-09 LAB — GLUCOSE, 1 HOUR GESTATIONAL: Gestational Diabetes Screen: 124 mg/dL (ref 70–139)

## 2021-05-14 LAB — AFP, SERUM, OPEN SPINA BIFIDA
AFP MoM: 1.83
AFP Value: 75.8 ng/mL
Gest. Age on Collection Date: 18.3 weeks
Maternal Age At EDD: 36 yr
OSBR Risk 1 IN: 2380
Test Results:: NEGATIVE
Weight: 219 [lb_av]

## 2021-05-22 ENCOUNTER — Other Ambulatory Visit: Payer: Self-pay

## 2021-05-22 ENCOUNTER — Ambulatory Visit (INDEPENDENT_AMBULATORY_CARE_PROVIDER_SITE_OTHER): Payer: 59

## 2021-05-22 ENCOUNTER — Other Ambulatory Visit: Payer: Self-pay | Admitting: Obstetrics and Gynecology

## 2021-05-22 DIAGNOSIS — Z3689 Encounter for other specified antenatal screening: Secondary | ICD-10-CM

## 2021-05-22 DIAGNOSIS — Z362 Encounter for other antenatal screening follow-up: Secondary | ICD-10-CM

## 2021-06-05 ENCOUNTER — Other Ambulatory Visit: Payer: Self-pay

## 2021-06-05 ENCOUNTER — Ambulatory Visit (INDEPENDENT_AMBULATORY_CARE_PROVIDER_SITE_OTHER): Payer: 59

## 2021-06-05 DIAGNOSIS — Z362 Encounter for other antenatal screening follow-up: Secondary | ICD-10-CM | POA: Diagnosis not present

## 2021-06-05 DIAGNOSIS — Z3A22 22 weeks gestation of pregnancy: Secondary | ICD-10-CM | POA: Diagnosis not present

## 2021-06-06 ENCOUNTER — Other Ambulatory Visit: Payer: Self-pay

## 2021-06-06 ENCOUNTER — Ambulatory Visit (INDEPENDENT_AMBULATORY_CARE_PROVIDER_SITE_OTHER): Payer: 59 | Admitting: Obstetrics and Gynecology

## 2021-06-06 ENCOUNTER — Encounter: Payer: Self-pay | Admitting: Obstetrics and Gynecology

## 2021-06-06 VITALS — BP 125/84 | HR 97 | Wt 218.7 lb

## 2021-06-06 DIAGNOSIS — O09522 Supervision of elderly multigravida, second trimester: Secondary | ICD-10-CM

## 2021-06-06 DIAGNOSIS — Z3A22 22 weeks gestation of pregnancy: Secondary | ICD-10-CM

## 2021-06-06 LAB — POCT URINALYSIS DIPSTICK OB
Bilirubin, UA: NEGATIVE
Blood, UA: NEGATIVE
Glucose, UA: NEGATIVE
Ketones, UA: NEGATIVE
Leukocytes, UA: NEGATIVE
Nitrite, UA: NEGATIVE
Spec Grav, UA: 1.015 (ref 1.010–1.025)
Urobilinogen, UA: 0.2 E.U./dL
pH, UA: 7 (ref 5.0–8.0)

## 2021-06-06 NOTE — Progress Notes (Signed)
ROB. Patient states she did have a fall in the parking lot walking in, scraping her knee and twisting her ankle. Patient states a concern of not gaining any weight and wanting to make sure the baby is growing on time. Patent states movement and no pain or pressure at this time.

## 2021-06-06 NOTE — Progress Notes (Signed)
ROB: Patient fell in her parking lot carrying packages.  She landed on her knees.  She states that she is all right and is moving appropriately.  She just completed her anatomy ultrasound yesterday.  She does have concerns that she is not gaining weight and is concerned for the baby.  We have reviewed her daily diet and I have discussed maternal weight gain as well as fetal weight gain.  I have reassured her that her diet is adequate and that the baby is growing appropriately.  She says that she is generally feeling better with less nausea.

## 2021-07-04 ENCOUNTER — Other Ambulatory Visit: Payer: 59

## 2021-07-04 ENCOUNTER — Ambulatory Visit (INDEPENDENT_AMBULATORY_CARE_PROVIDER_SITE_OTHER): Payer: 59 | Admitting: Obstetrics and Gynecology

## 2021-07-04 ENCOUNTER — Other Ambulatory Visit: Payer: Self-pay

## 2021-07-04 ENCOUNTER — Encounter: Payer: Self-pay | Admitting: Obstetrics and Gynecology

## 2021-07-04 VITALS — BP 135/84 | HR 93 | Wt 225.0 lb

## 2021-07-04 DIAGNOSIS — Z3A26 26 weeks gestation of pregnancy: Secondary | ICD-10-CM

## 2021-07-04 DIAGNOSIS — Z3482 Encounter for supervision of other normal pregnancy, second trimester: Secondary | ICD-10-CM

## 2021-07-04 DIAGNOSIS — Z113 Encounter for screening for infections with a predominantly sexual mode of transmission: Secondary | ICD-10-CM

## 2021-07-04 DIAGNOSIS — O26842 Uterine size-date discrepancy, second trimester: Secondary | ICD-10-CM

## 2021-07-04 DIAGNOSIS — Z131 Encounter for screening for diabetes mellitus: Secondary | ICD-10-CM

## 2021-07-04 DIAGNOSIS — O09292 Supervision of pregnancy with other poor reproductive or obstetric history, second trimester: Secondary | ICD-10-CM

## 2021-07-04 NOTE — Progress Notes (Signed)
ROB: She has nasal congestion and nosebleeds, otherwise doing well, no new concerns.

## 2021-07-04 NOTE — Progress Notes (Signed)
ROB: Patient c/o nasal congestion and nosebleeds.  Started back taking Zyrtec last week. Also recommend nasal saline spray. Notes Vitamin B complex is helping her nausea. Still notes pelvic pressure, advised on pregnancy garment. Monitor growth as S>D today, has h/o macrosomia. May consider [place] at 32 weeks if discrepancy continues. RTC in 2 weeks, for glucola at that time. Did not void today.

## 2021-07-07 NOTE — L&D Delivery Note (Signed)
Delivery Summary for [employer] ? ?Labor Events:   ?Preterm labor: No data found  ?Rupture date: No data found  ?Rupture time: No data found  ?Rupture type: No data found  ?Fluid Color: No data found  ?Induction: No data found  ?Augmentation: No data found  ?Complications: No data found  ?Cervical ripening: No data found No data found  ? No data found  ?   ?Delivery:   ?Episiotomy: No data found  ?Lacerations: No data found  ?Repair suture: No data found  ?Repair # of packets: No data found  ?Blood loss (ml): No data found  ? ?Information for the patient's newborn:  Robie, Oats [[id number]]  ? ?Delivery ?09/25/2021 5:59 PM by  C-Section, Low Transverse ?Sex:  female Gestational Age: [redacted]w[redacted]d ?Delivery Clinician:   ?Living?:  ? ?      APGARS  One minute Five minutes Ten minutes  ?Skin color:        ?Heart rate:        ?Grimace:        ?Muscle tone:        ?Breathing:        ?Totals: 2  8     ? ?Presentation/position:      ?Resuscitation:   ?Cord information:    Disposition of cord blood:     Blood gases sent?  ?Complications:   ?Placenta: Delivered:       appearance ?Newborn Measurements: ?Weight: 6 lb 4.2 oz (2840 g)  Height: 19.49"  Head circumference:    Chest circumference:    ?Other providers:    ?Additional  information: ?Forceps:   ?Vacuum:   ?Breech:   ?Observed anomalies   ? ? ? ? ?See Dr. Oretha Milch C-section procedure note for details of delivery.  ? ? ?Hildred Laser, MD ?[facility] ? ? ? ?

## 2021-07-18 ENCOUNTER — Other Ambulatory Visit: Payer: 59

## 2021-07-18 ENCOUNTER — Other Ambulatory Visit: Payer: Self-pay

## 2021-07-18 ENCOUNTER — Encounter: Payer: Self-pay | Admitting: Obstetrics and Gynecology

## 2021-07-18 ENCOUNTER — Other Ambulatory Visit (HOSPITAL_COMMUNITY)
Admission: RE | Admit: 2021-07-18 | Discharge: 2021-07-18 | Disposition: A | Discharge status: Home / Self Care | Payer: 59 | Source: Ambulatory Visit | Attending: Obstetrics and Gynecology | Admitting: Obstetrics and Gynecology

## 2021-07-18 ENCOUNTER — Ambulatory Visit (INDEPENDENT_AMBULATORY_CARE_PROVIDER_SITE_OTHER): Payer: 59 | Admitting: Obstetrics and Gynecology

## 2021-07-18 VITALS — BP 117/78 | HR 102 | Wt 227.9 lb

## 2021-07-18 DIAGNOSIS — Z3483 Encounter for supervision of other normal pregnancy, third trimester: Secondary | ICD-10-CM

## 2021-07-18 DIAGNOSIS — N3001 Acute cystitis with hematuria: Secondary | ICD-10-CM

## 2021-07-18 DIAGNOSIS — Z131 Encounter for screening for diabetes mellitus: Secondary | ICD-10-CM | POA: Diagnosis not present

## 2021-07-18 DIAGNOSIS — Z113 Encounter for screening for infections with a predominantly sexual mode of transmission: Secondary | ICD-10-CM

## 2021-07-18 DIAGNOSIS — R69 Illness, unspecified: Secondary | ICD-10-CM | POA: Diagnosis not present

## 2021-07-18 DIAGNOSIS — N898 Other specified noninflammatory disorders of vagina: Secondary | ICD-10-CM | POA: Insufficient documentation

## 2021-07-18 DIAGNOSIS — Z3A28 28 weeks gestation of pregnancy: Secondary | ICD-10-CM

## 2021-07-18 DIAGNOSIS — B3731 Acute candidiasis of vulva and vagina: Secondary | ICD-10-CM | POA: Insufficient documentation

## 2021-07-18 DIAGNOSIS — Z23 Encounter for immunization: Secondary | ICD-10-CM

## 2021-07-18 LAB — POCT URINALYSIS DIPSTICK OB
Bilirubin, UA: NEGATIVE
Glucose, UA: NEGATIVE
Ketones, UA: NEGATIVE
Nitrite, UA: NEGATIVE
Spec Grav, UA: 1.015 (ref 1.010–1.025)
Urobilinogen, UA: 0.2 E.U./dL
pH, UA: 6.5 (ref 5.0–8.0)

## 2021-07-18 MED ORDER — NITROFURANTOIN MONOHYD MACRO 100 MG PO CAPS: 100.0000 mg | ORAL_CAPSULE | Freq: Two times a day (BID) | ORAL | 1 refills | Status: DC

## 2021-07-18 NOTE — Progress Notes (Signed)
ROB: Complains of a greenish vaginal discharge with occasional itching on the outside.  Complains of urinary pressure but this has been present for a long time.  UA shows leukocytes and blood.  We will presumptively treat with Macrobid and send C&S.  Vaginal Nuswab performed.  1 hour GCT today.

## 2021-07-18 NOTE — Addendum Note (Signed)
Addended by: Tommie Raymond on: 07/18/2021 11:31 AM   Modules accepted: Orders

## 2021-07-18 NOTE — Progress Notes (Signed)
ROB: She is doing well, no new concerns. She is having some greenish discharge.

## 2021-07-19 LAB — CERVICOVAGINAL ANCILLARY ONLY
Bacterial Vaginitis (gardnerella): NEGATIVE
Candida Glabrata: NEGATIVE
Candida Vaginitis: POSITIVE — AB
Chlamydia: NEGATIVE
Comment: NEGATIVE
Comment: NEGATIVE
Comment: NEGATIVE
Comment: NEGATIVE
Comment: NEGATIVE
Comment: NORMAL
Neisseria Gonorrhea: NEGATIVE
Trichomonas: NEGATIVE

## 2021-07-19 LAB — CBC
Hematocrit: 32.4 % — ABNORMAL LOW (ref 34.0–46.6)
Hemoglobin: 11.8 g/dL (ref 11.1–15.9)
MCH: 31.5 pg (ref 26.6–33.0)
MCHC: 36.4 g/dL — ABNORMAL HIGH (ref 31.5–35.7)
MCV: 86 fL (ref 79–97)
Platelets: 194 10*3/uL (ref 150–450)
RBC: 3.75 x10E6/uL — ABNORMAL LOW (ref 3.77–5.28)
RDW: 13.8 % (ref 11.7–15.4)
WBC: 7.5 10*3/uL (ref 3.4–10.8)

## 2021-07-19 LAB — RPR: RPR Ser Ql: NONREACTIVE

## 2021-07-19 LAB — GLUCOSE, 1 HOUR GESTATIONAL: Gestational Diabetes Screen: 136 mg/dL (ref 70–139)

## 2021-07-20 LAB — URINE CULTURE

## 2021-07-21 NOTE — Progress Notes (Signed)
Brisha: Your results show that you have a yeast infection.  There are no other signs of infection from your cultures including urinary tract infection.  During pregnancy we often recommend the use of Monistat vaginal suppositories and cream.  This is available over-the-counter at most pharmacies.

## 2021-08-02 ENCOUNTER — Encounter: Payer: Self-pay | Admitting: Obstetrics and Gynecology

## 2021-08-02 ENCOUNTER — Other Ambulatory Visit: Payer: Self-pay

## 2021-08-02 ENCOUNTER — Ambulatory Visit (INDEPENDENT_AMBULATORY_CARE_PROVIDER_SITE_OTHER): Payer: 59 | Admitting: Obstetrics and Gynecology

## 2021-08-02 VITALS — BP 138/82 | HR 114 | Wt 230.0 lb

## 2021-08-02 DIAGNOSIS — Z3483 Encounter for supervision of other normal pregnancy, third trimester: Secondary | ICD-10-CM

## 2021-08-02 DIAGNOSIS — Z3A30 30 weeks gestation of pregnancy: Secondary | ICD-10-CM

## 2021-08-02 LAB — POCT URINALYSIS DIPSTICK OB
Bilirubin, UA: NEGATIVE
Blood, UA: NEGATIVE
Glucose, UA: NEGATIVE
Ketones, UA: NEGATIVE
Leukocytes, UA: NEGATIVE
Nitrite, UA: NEGATIVE
POC,PROTEIN,UA: NEGATIVE
Spec Grav, UA: 1.015 (ref 1.010–1.025)
Urobilinogen, UA: NEGATIVE E.U./dL — AB
pH, UA: 6 (ref 5.0–8.0)

## 2021-08-02 NOTE — Patient Instructions (Addendum)
      Doula Services at [facility]  Please email us at: [email or web address]. We do have a phone number, but it is a voicemail only, [phone]. We can then call the client back and collect info from them to ensure they qualify for no-cost doula services and match them with a doula.   You may also complete a Request Form online.  Please view the Doula Request Form ( [email or web address]) and submit it if you meet the qualifying criteria listed within the form. If you do not meet the criteria for the program to receive a doula at no cost, we have attached a list of private doulas who are credentialed with [facility] that you can connect with and retain for your birth.   Currently, our program is only for birth, but most of our doulas connect with clients and provide support in the prenatal period as well.      

## 2021-08-02 NOTE — Progress Notes (Signed)
ROB: Doing okk except for heartburn in the middle of the night.  Drinks water and repositions herself which helps. Discussed OTC meds for remedy as well. Also noting some cramping in the middle of night in her legs. Discussed Tums or warm milk. Reviewed labs, normal glucola.   Plans to breastfeed, desires  unsure method  for contraception. Discussed labor plans, desires natural labor. Offered info on doula.  Continue to monitor BPs, noting some mild elevations intermittently.  RTC in 2 weeks.

## 2021-08-15 ENCOUNTER — Encounter: Payer: Self-pay | Admitting: Obstetrics and Gynecology

## 2021-08-15 ENCOUNTER — Ambulatory Visit: Payer: 59 | Admitting: Obstetrics and Gynecology

## 2021-08-15 ENCOUNTER — Other Ambulatory Visit: Payer: Self-pay

## 2021-08-15 VITALS — BP 125/76 | HR 91 | Wt 234.5 lb

## 2021-08-15 DIAGNOSIS — Z3A32 32 weeks gestation of pregnancy: Secondary | ICD-10-CM

## 2021-08-15 DIAGNOSIS — Z3483 Encounter for supervision of other normal pregnancy, third trimester: Secondary | ICD-10-CM

## 2021-08-15 LAB — POCT URINALYSIS DIPSTICK OB
Appearance: NEGATIVE
Bilirubin, UA: NEGATIVE
Blood, UA: NEGATIVE
Glucose, UA: NEGATIVE
Ketones, UA: NEGATIVE
Leukocytes, UA: NEGATIVE
Nitrite, UA: NEGATIVE
POC,PROTEIN,UA: NEGATIVE
Spec Grav, UA: 1.015 (ref 1.010–1.025)
Urobilinogen, UA: 0.2 E.U./dL
pH, UA: 6 (ref 5.0–8.0)

## 2021-08-15 NOTE — Progress Notes (Signed)
ROB: Patient has generalized issues of pregnancy including difficulty sleeping, occasional leg cramps, nausea with teeth brushing.  She also has begun having frequent nosebleeds.  Have advised her to stop ASA 81 and see if this affects her nosebleeds. Dial soap discussed for leg cramps. :).  Consider ultrasound later in third trimester for fetal growth/size. (History of macrosomia) and elevated A1c.

## 2021-08-15 NOTE — Progress Notes (Signed)
ROB. Patient states feeling nauseous frequently, especially after brushing her teeth. Patient also states having nose bleeds approximately once a week in the morning, light trickle of blood. Patient states fetal movement and no pressure. Patient states no questions or concerns.

## 2021-08-19 DIAGNOSIS — Z3483 Encounter for supervision of other normal pregnancy, third trimester: Secondary | ICD-10-CM | POA: Diagnosis not present

## 2021-08-19 DIAGNOSIS — Z3482 Encounter for supervision of other normal pregnancy, second trimester: Secondary | ICD-10-CM | POA: Diagnosis not present

## 2021-09-03 ENCOUNTER — Encounter: Payer: Self-pay | Admitting: Obstetrics and Gynecology

## 2021-09-03 ENCOUNTER — Ambulatory Visit (INDEPENDENT_AMBULATORY_CARE_PROVIDER_SITE_OTHER): Payer: 59 | Admitting: Obstetrics and Gynecology

## 2021-09-03 ENCOUNTER — Other Ambulatory Visit: Payer: Self-pay

## 2021-09-03 VITALS — BP 126/82 | HR 97 | Wt 240.5 lb

## 2021-09-03 DIAGNOSIS — O09299 Supervision of pregnancy with other poor reproductive or obstetric history, unspecified trimester: Secondary | ICD-10-CM

## 2021-09-03 DIAGNOSIS — Z3A35 35 weeks gestation of pregnancy: Secondary | ICD-10-CM

## 2021-09-03 DIAGNOSIS — Z3483 Encounter for supervision of other normal pregnancy, third trimester: Secondary | ICD-10-CM

## 2021-09-03 LAB — POCT URINALYSIS DIPSTICK OB
Bilirubin, UA: NEGATIVE
Glucose, UA: NEGATIVE
Ketones, UA: NEGATIVE
Nitrite, UA: NEGATIVE
Spec Grav, UA: 1.015 (ref 1.010–1.025)
Urobilinogen, UA: 0.2 E.U./dL
pH, UA: 6.5 (ref 5.0–8.0)

## 2021-09-03 NOTE — Progress Notes (Signed)
ROB: She is doing well. No new concerns today. ?

## 2021-09-03 NOTE — Patient Instructions (Signed)
WHAT OB PATIENTS CAN EXPECT  Confirmation of pregnancy and ultrasound ordered if medically indicated-[redacted] weeks gestation New OB (NOB) intake with nurse and New OB (NOB) labs- [redacted] weeks gestation New OB (NOB) physical examination with provider- 11/[redacted] weeks gestation Flu vaccine-[redacted] weeks gestation Anatomy scan-[redacted] weeks gestation Glucose tolerance test, blood work to test for anemia, T-dap vaccine-[redacted] weeks gestation Vaginal swabs/cultures-STD/Group B strep-[redacted] weeks gestation Appointments every 4 weeks until 28 weeks Every 2 weeks from 28 weeks until 36 weeks Weekly visits from 24 weeks until delivery    Third Trimester of Pregnancy The third trimester of pregnancy is from week 28 through week 99. This is also called months 7 through 9. This trimester is when your unborn baby (fetus) is growing very fast. At the end of the ninth month, the unborn baby is about 20 inches long. It weighs about 6-10 pounds. Body changes during your third trimester Your body continues to go through many changes during this time. The changes vary and generally return to normal after the baby is born. Physical changes Your weight will continue to increase. You may gain 25-35 pounds (11-16 kg) by the end of the pregnancy. If you are underweight, you may gain 28-40 lb (about 13-18 kg). If you are overweight, you may gain 15-25 lb (about 7-11 kg). You may start to get stretch marks on your hips, belly (abdomen), and breasts. Your breasts will continue to grow and may hurt. A yellow fluid (colostrum) may leak from your breasts. This is the first milk you are making for your baby. You may have changes in your hair. Your belly button may stick out. You may have more swelling in your hands, face, or ankles. Health changes You may have heartburn. You may have trouble pooping (constipation). You may get hemorrhoids. These are swollen veins in the butt that can itch or get painful. You may have swollen veins (varicose veins)  in your legs. You may have more body aches in the pelvis, back, or thighs. You may have more tingling or numbness in your hands, arms, and legs. The skin on your belly may also feel numb. You may feel short of breath as your womb (uterus) gets bigger. Other changes You may pee (urinate) more often. You may have more problems sleeping. You may notice the unborn baby "dropping," or moving lower in your belly. You may have more discharge coming from your vagina. Your joints may feel loose, and you may have pain around your pelvic bone. Follow these instructions at home: Medicines Take over-the-counter and prescription medicines only as told by your doctor. Some medicines are not safe during pregnancy. Take a prenatal vitamin that contains at least 600 micrograms (mcg) of folic acid. Eating and drinking Eat healthy meals that include: Fresh fruits and vegetables. Whole grains. Good sources of protein, such as meat, eggs, or tofu. Low-fat dairy products. Avoid raw meat and unpasteurized juice, milk, and cheese. These carry germs that can harm you and your baby. Eat 4 or 5 small meals rather than 3 large meals a day. You may need to take these actions to prevent or treat trouble pooping: Drink enough fluids to keep your pee (urine) pale yellow. Eat foods that are high in fiber. These include beans, whole grains, and fresh fruits and vegetables. Limit foods that are high in fat and sugar. These include fried or sweet foods. Activity Exercise only as told by your doctor. Stop exercising if you start to have cramps in your womb. Avoid  heavy lifting. Do not exercise if it is too hot or too humid, or if you are in a place of great height (high altitude). If you choose to, you may have sex unless your doctor tells you not to. Relieving pain and discomfort Take breaks often, and rest with your legs raised (elevated) if you have leg cramps or low back pain. Take warm water baths (sitz baths) to  soothe pain or discomfort caused by hemorrhoids. Use hemorrhoid cream if your doctor approves. Wear a good support bra if your breasts are tender. If you develop bulging, swollen veins in your legs: Wear support hose as told by your doctor. Raise your feet for 15 minutes, 3-4 times a day. Limit salt in your food. Safety Talk to your doctor before traveling far distances. Do not use hot tubs, steam rooms, or saunas. Wear your seat belt at all times when you are in a car. Talk with your doctor if someone is hurting you or yelling at you a lot. Preparing for your baby's arrival To prepare for the arrival of your baby: Take prenatal classes. Visit the hospital and tour the maternity area. Buy a rear-facing car seat. Learn how to install it in your car. Prepare the baby's room. Take out all pillows and stuffed animals from the baby's crib. General instructions Avoid cat litter boxes and soil used by cats. These carry germs that can cause harm to the baby and can cause a loss of your baby by miscarriage or stillbirth. Do not douche or use tampons. Do not use scented sanitary pads. Do not smoke or use any products that contain nicotine or tobacco. If you need help quitting, ask your doctor. Do not drink alcohol. Do not use herbal medicines, illegal drugs, or medicines that were not approved by your doctor. Chemicals in these products can affect your baby. Keep all follow-up visits. This is important. Where to find more information American Pregnancy Association: americanpregnancy.org [employer]: www.acog.org Office on Women's Health: KeywordPortfolios.com.br Contact a doctor if: You have a fever. You have mild cramps or pressure in your lower belly. You have a nagging pain in your belly area. You vomit, or you have watery poop (diarrhea). You have bad-smelling fluid coming from your vagina. You have pain when you pee, or your pee smells bad. You  have a headache that does not go away when you take medicine. You have changes in how you see, or you see spots in front of your eyes. Get help right away if: Your water breaks. You have regular contractions that are less than 5 minutes apart. You are spotting or bleeding from your vagina. You have very bad belly cramps or pain. You have trouble breathing. You have chest pain. You faint. You have not felt the baby move for the amount of time told by your doctor. You have new or increased pain, swelling, or redness in an arm or leg. Summary The third trimester is from week 28 through week 40 (months 7 through 9). This is the time when your unborn baby is growing very fast. During this time, your discomfort may increase as you gain weight and as your baby grows. Get ready for your baby to arrive by taking prenatal classes, buying a rear-facing car seat, and preparing the baby's room. Get help right away if you are bleeding from your vagina, you have chest pain and trouble breathing, or you have not felt the baby move for the amount of time  told by your doctor. This information is not intended to replace advice given to you by your health care provider. Make sure you discuss any questions you have with your health care provider. Document Revised: 11/30/2019 Document Reviewed: 10/06/2019 Elsevier Patient Education  2022 [employer].

## 2021-09-03 NOTE — Progress Notes (Signed)
ROB: Notes Sara Burch.  Will schedule for growth scan in next 1-2 weeks for h/o macrosomia. RTC in 1 week, for 36 week labs.

## 2021-09-10 ENCOUNTER — Ambulatory Visit (INDEPENDENT_AMBULATORY_CARE_PROVIDER_SITE_OTHER): Payer: 59 | Admitting: Obstetrics and Gynecology

## 2021-09-10 ENCOUNTER — Encounter: Payer: Self-pay | Admitting: Obstetrics and Gynecology

## 2021-09-10 ENCOUNTER — Other Ambulatory Visit: Payer: Self-pay

## 2021-09-10 VITALS — BP 116/82 | HR 116 | Wt 241.1 lb

## 2021-09-10 DIAGNOSIS — Z113 Encounter for screening for infections with a predominantly sexual mode of transmission: Secondary | ICD-10-CM | POA: Diagnosis not present

## 2021-09-10 DIAGNOSIS — Z3483 Encounter for supervision of other normal pregnancy, third trimester: Secondary | ICD-10-CM | POA: Diagnosis not present

## 2021-09-10 DIAGNOSIS — Z3685 Encounter for antenatal screening for Streptococcus B: Secondary | ICD-10-CM

## 2021-09-10 DIAGNOSIS — Z3A36 36 weeks gestation of pregnancy: Secondary | ICD-10-CM

## 2021-09-10 DIAGNOSIS — R69 Illness, unspecified: Secondary | ICD-10-CM | POA: Diagnosis not present

## 2021-09-10 LAB — POCT URINALYSIS DIPSTICK OB
Bilirubin, UA: NEGATIVE
Glucose, UA: NEGATIVE
Ketones, UA: NEGATIVE
Nitrite, UA: NEGATIVE
Spec Grav, UA: 1.015 (ref 1.010–1.025)
Urobilinogen, UA: 0.2 E.U./dL
pH, UA: 6.5 (ref 5.0–8.0)

## 2021-09-10 NOTE — Progress Notes (Signed)
ROB. Patient states fetal movement with pelvis pressure. G/C and GBS cultures due today, ordered.Patient states no questions or concerns at this time.   ?

## 2021-09-10 NOTE — Progress Notes (Signed)
ROB: Has 2 or 3 contractions per day.  Continues to experience some sciatica in her right leg.  GC/CT-GBS performed today.  GBS discussed.  Ultrasound next week as scheduled. ?

## 2021-09-12 LAB — URINE CULTURE

## 2021-09-12 LAB — STREP GP B NAA: Strep Gp B NAA: POSITIVE — AB

## 2021-09-12 LAB — GC/CHLAMYDIA PROBE AMP
Chlamydia trachomatis, NAA: NEGATIVE
Neisseria Gonorrhoeae by PCR: NEGATIVE

## 2021-09-17 ENCOUNTER — Ambulatory Visit (INDEPENDENT_AMBULATORY_CARE_PROVIDER_SITE_OTHER): Payer: 59

## 2021-09-17 ENCOUNTER — Other Ambulatory Visit: Payer: Self-pay

## 2021-09-17 DIAGNOSIS — O09299 Supervision of pregnancy with other poor reproductive or obstetric history, unspecified trimester: Secondary | ICD-10-CM | POA: Diagnosis not present

## 2021-09-17 DIAGNOSIS — Z3483 Encounter for supervision of other normal pregnancy, third trimester: Secondary | ICD-10-CM

## 2021-09-20 ENCOUNTER — Ambulatory Visit (INDEPENDENT_AMBULATORY_CARE_PROVIDER_SITE_OTHER): Payer: 59 | Admitting: Obstetrics and Gynecology

## 2021-09-20 ENCOUNTER — Encounter: Payer: Self-pay | Admitting: Obstetrics and Gynecology

## 2021-09-20 ENCOUNTER — Other Ambulatory Visit: Payer: Self-pay

## 2021-09-20 VITALS — BP 131/84 | HR 94 | Wt 248.2 lb

## 2021-09-20 DIAGNOSIS — Z3A37 37 weeks gestation of pregnancy: Secondary | ICD-10-CM

## 2021-09-20 DIAGNOSIS — Z3483 Encounter for supervision of other normal pregnancy, third trimester: Secondary | ICD-10-CM

## 2021-09-20 LAB — POCT URINALYSIS DIPSTICK OB
Bilirubin, UA: NEGATIVE
Blood, UA: NEGATIVE
Glucose, UA: NEGATIVE
Ketones, UA: NEGATIVE
Nitrite, UA: NEGATIVE
POC,PROTEIN,UA: NEGATIVE
Spec Grav, UA: 1.01 (ref 1.010–1.025)
Urobilinogen, UA: 0.2 E.U./dL
pH, UA: 7 (ref 5.0–8.0)

## 2021-09-20 NOTE — Progress Notes (Signed)
ROB: Noes some pelvic pressure.  Reviewed recent growth [place] for h/o macrosomia, 48%ile but breech presentation noted.  Discussed moxibustion, ECV, chiropractor, [employer] website.  Patient will try natural methods first.  To f/u in 1 week, if no version by that time, can decide on ECV or will proced with scheduling C-section.  36 week cultures done, is GBS+, will need Abx if labor occurs. BMI now >40, will begin NSTs. ?

## 2021-09-20 NOTE — Progress Notes (Signed)
ROB. Patient states fetal movement with pressure.  °Patient states no questions or concerns at this time.  °

## 2021-09-20 NOTE — Patient Instructions (Signed)
Breech Birth °A breech birth is when a baby is born with the buttocks or feet first. Most babies are in a head down (vertex) position when they are born. There are three types of breech babies: °When the baby's buttocks are showing first in the vagina (birth canal) with the legs bent at the knees and the feet down near the buttocks (complete breech). °When the baby's buttocks are showing first in the birth canal with the legs straight up and the feet at the baby's head (frank breech). °When one or both of the baby's feet are showing first in the birth canal along with the buttocks (footling breech). °What increases the risk of having a breech baby? °It is not known what causes your baby to be breech. However, you are more likely to have a breech baby if: °You have had a previous pregnancy. °You are having more than one baby. °Your baby has certain birth (congenital) defects. °You have started your labor earlier than expected (premature labor). °You have problems with your uterus, such as a growths or an abnormally-shaped uterus. °You have too much or not enough fluid surrounding the baby (amniotic fluid). °The placenta covers all or part of the opening of the uterus (placenta previa). °How does this affect me? °There are no symptoms for you to know that your baby is breech. When you are close to your due date, your health care provider can tell if your baby is breech by doing: °An abdominal or vaginal (pelvic) exam. °An ultrasound. °You and your health care provider will discuss the best way to deliver your baby. If your baby is breech, it is less likely that a vaginal delivery will be recommended due to the risks to you and your baby. °How does this affect my baby? °Having a breech birth increases the health risks to your baby. A breech birth may cause the following: °Umbilical cord prolapse. This is when the umbilical cord enters the birth canal ahead of the baby, before or during labor. This can cause the cord to  become pinched or compressed as labor continues. This can reduce the flow of blood and oxygen to the baby. °The baby getting stuck in the birth canal, which can cause injury or, rarely, death. °Injury to the baby's nerves in the shoulder, arm, and hand (brachial plexus injury) when delivered. °How is this treated? °Your health care provider may try to turn the baby in your uterus. He or she will use a procedure called external cephalic version (ECV). He or she will place both hands on your abdomen and gently and slowly turn the baby around. It is important to know that ECV can increase your chances of suddenly going into labor. °For this reason, an ECV is only done toward the end of a healthy pregnancy. The baby may remain in this position, but sometimes he or she may turn back to the breech position. You and your health care provider will discuss if an ECV is recommended for you and your baby. °Your health care provider may recommend that you deliver your baby through a cesarean delivery (C-section). A C-section is the surgical delivery of a baby through an incision in the abdomen and the uterus. °Contact a health care provider if: °Get help right away if: °Your baby is breech and you have regular painful contractions or loss of your mucus plug (bloody show). °Your water breaks. °You see the baby's umbilical cord protruding from your vagina. °Summary °A breech birth is when a   baby is born with the buttocks or feet first. ?Having a breech birth may increase the risks to your baby. ?Your health care provider may try to turn your baby in your uterus using a procedure called an external cephalic version (ECV). ?If your baby cannot be turned to a head down position or if your baby remains in a breech position, your health care provider will make recommendations about the safest way to deliver your baby. ?This information is not intended to replace advice given to you by your health care provider. Make sure you discuss  any questions you have with your health care provider. ?Document Revised: 04/09/2020 Document Reviewed: 04/09/2020 ?Elsevier Patient Education ? [place]. ? ?

## 2021-09-25 ENCOUNTER — Encounter: Payer: 59 | Admitting: Obstetrics and Gynecology

## 2021-09-25 ENCOUNTER — Encounter: Payer: Self-pay | Admitting: Obstetrics and Gynecology

## 2021-09-25 ENCOUNTER — Ambulatory Visit (INDEPENDENT_AMBULATORY_CARE_PROVIDER_SITE_OTHER): Payer: 59 | Admitting: Obstetrics and Gynecology

## 2021-09-25 ENCOUNTER — Inpatient Hospital Stay: Payer: 59

## 2021-09-25 ENCOUNTER — Inpatient Hospital Stay: Payer: 59 | Admitting: Anesthesiology

## 2021-09-25 ENCOUNTER — Other Ambulatory Visit: Payer: 59

## 2021-09-25 ENCOUNTER — Encounter
Admission: EM | Disposition: A | Discharge status: Home / Self Care | Payer: Self-pay | Source: Home / Self Care | Attending: Obstetrics and Gynecology

## 2021-09-25 ENCOUNTER — Other Ambulatory Visit: Payer: Self-pay

## 2021-09-25 ENCOUNTER — Inpatient Hospital Stay
Admission: EM | Admit: 2021-09-25 | Discharge: 2021-09-27 | DRG: 784 | Disposition: A | Discharge status: Home / Self Care | Payer: 59 | Attending: Obstetrics and Gynecology | Admitting: Obstetrics and Gynecology

## 2021-09-25 VITALS — BP 148/90 | HR 103 | Wt 246.3 lb

## 2021-09-25 DIAGNOSIS — O4202 Full-term premature rupture of membranes, onset of labor within 24 hours of rupture: Secondary | ICD-10-CM

## 2021-09-25 DIAGNOSIS — O99214 Obesity complicating childbirth: Secondary | ICD-10-CM

## 2021-09-25 DIAGNOSIS — O4103X0 Oligohydramnios, third trimester, not applicable or unspecified: Secondary | ICD-10-CM | POA: Diagnosis not present

## 2021-09-25 DIAGNOSIS — O09299 Supervision of pregnancy with other poor reproductive or obstetric history, unspecified trimester: Secondary | ICD-10-CM

## 2021-09-25 DIAGNOSIS — Z3A00 Weeks of gestation of pregnancy not specified: Secondary | ICD-10-CM | POA: Diagnosis not present

## 2021-09-25 DIAGNOSIS — Z302 Encounter for sterilization: Secondary | ICD-10-CM

## 2021-09-25 DIAGNOSIS — G8918 Other acute postprocedural pain: Secondary | ICD-10-CM | POA: Diagnosis not present

## 2021-09-25 DIAGNOSIS — O99824 Streptococcus B carrier state complicating childbirth: Secondary | ICD-10-CM | POA: Diagnosis present

## 2021-09-25 DIAGNOSIS — Z3A38 38 weeks gestation of pregnancy: Secondary | ICD-10-CM

## 2021-09-25 DIAGNOSIS — O4290 Premature rupture of membranes, unspecified as to length of time between rupture and onset of labor, unspecified weeks of gestation: Secondary | ICD-10-CM | POA: Diagnosis present

## 2021-09-25 DIAGNOSIS — O321XX0 Maternal care for breech presentation, not applicable or unspecified: Secondary | ICD-10-CM

## 2021-09-25 DIAGNOSIS — Z3009 Encounter for other general counseling and advice on contraception: Secondary | ICD-10-CM

## 2021-09-25 DIAGNOSIS — O3663X0 Maternal care for excessive fetal growth, third trimester, not applicable or unspecified: Secondary | ICD-10-CM

## 2021-09-25 DIAGNOSIS — Z3A37 37 weeks gestation of pregnancy: Secondary | ICD-10-CM | POA: Diagnosis not present

## 2021-09-25 DIAGNOSIS — E669 Obesity, unspecified: Secondary | ICD-10-CM

## 2021-09-25 DIAGNOSIS — O99820 Streptococcus B carrier state complicating pregnancy: Secondary | ICD-10-CM

## 2021-09-25 DIAGNOSIS — O4292 Full-term premature rupture of membranes, unspecified as to length of time between rupture and onset of labor: Secondary | ICD-10-CM | POA: Diagnosis not present

## 2021-09-25 DIAGNOSIS — O99210 Obesity complicating pregnancy, unspecified trimester: Secondary | ICD-10-CM

## 2021-09-25 DIAGNOSIS — Z3483 Encounter for supervision of other normal pregnancy, third trimester: Secondary | ICD-10-CM

## 2021-09-25 DIAGNOSIS — R1084 Generalized abdominal pain: Secondary | ICD-10-CM | POA: Diagnosis not present

## 2021-09-25 LAB — CBC
HCT: 38.7 % (ref 36.0–46.0)
Hemoglobin: 12.6 g/dL (ref 12.0–15.0)
MCH: 29 pg (ref 26.0–34.0)
MCHC: 32.6 g/dL (ref 30.0–36.0)
MCV: 89.2 fL (ref 80.0–100.0)
Platelets: 181 10*3/uL (ref 150–400)
RBC: 4.34 MIL/uL (ref 3.87–5.11)
RDW: 15.5 % (ref 11.5–15.5)
WBC: 7.2 10*3/uL (ref 4.0–10.5)
nRBC: 0 % (ref 0.0–0.2)

## 2021-09-25 LAB — TYPE AND SCREEN
ABO/RH(D): A POS
Antibody Screen: NEGATIVE

## 2021-09-25 LAB — RUPTURE OF MEMBRANE (ROM)PLUS: Rom Plus: POSITIVE

## 2021-09-25 LAB — ABO/RH: ABO/RH(D): A POS

## 2021-09-25 SURGERY — Surgical Case
Anesthesia: Spinal

## 2021-09-25 MED ORDER — ACETAMINOPHEN 325 MG PO TABS
650.0000 mg | ORAL_TABLET | Freq: Four times a day (QID) | ORAL | Status: AC
  Administered 2021-09-25 – 2021-09-26 (×4): 650 mg via ORAL
  Filled 2021-09-25 (×4): qty 2

## 2021-09-25 MED ORDER — KETOROLAC TROMETHAMINE 30 MG/ML IJ SOLN
30.0000 mg | Freq: Four times a day (QID) | INTRAMUSCULAR | Status: AC
  Administered 2021-09-25 – 2021-09-26 (×4): 30 mg via INTRAVENOUS
  Filled 2021-09-25 (×3): qty 1

## 2021-09-25 MED ORDER — EPHEDRINE SULFATE (PRESSORS) 50 MG/ML IJ SOLN
INTRAMUSCULAR | Status: DC | PRN
  Administered 2021-09-25: 10 mg via INTRAVENOUS

## 2021-09-25 MED ORDER — KETOROLAC TROMETHAMINE 30 MG/ML IJ SOLN
INTRAMUSCULAR | Status: DC | PRN
  Administered 2021-09-25: 30 mg via INTRAVENOUS

## 2021-09-25 MED ORDER — KETOROLAC TROMETHAMINE 30 MG/ML IJ SOLN
30.0000 mg | Freq: Four times a day (QID) | INTRAMUSCULAR | Status: DC
  Filled 2021-09-25: qty 1

## 2021-09-25 MED ORDER — CEFAZOLIN SODIUM-DEXTROSE 2-4 GM/100ML-% IV SOLN
INTRAVENOUS | Status: AC
  Filled 2021-09-25: qty 100

## 2021-09-25 MED ORDER — MINERAL OIL PO OIL
45.0000 mL | TOPICAL_OIL | Freq: Once | ORAL | Status: DC
  Filled 2021-09-25: qty 45

## 2021-09-25 MED ORDER — 0.9 % SODIUM CHLORIDE (POUR BTL) OPTIME
TOPICAL | Status: DC | PRN
  Administered 2021-09-25: 50 mL

## 2021-09-25 MED ORDER — BUPIVACAINE HCL (PF) 0.5 % IJ SOLN
INTRAMUSCULAR | Status: AC
  Filled 2021-09-25: qty 30

## 2021-09-25 MED ORDER — OXYTOCIN-SODIUM CHLORIDE 30-0.9 UT/500ML-% IV SOLN
INTRAVENOUS | Status: DC | PRN
  Administered 2021-09-25: 30 mL via INTRAVENOUS
  Administered 2021-09-25: 470 mL via INTRAVENOUS

## 2021-09-25 MED ORDER — OXYTOCIN-SODIUM CHLORIDE 30-0.9 UT/500ML-% IV SOLN: 2.5000 [IU]/h | INTRAVENOUS | Status: AC

## 2021-09-25 MED ORDER — ZOLPIDEM TARTRATE 5 MG PO TABS: 5.0000 mg | ORAL_TABLET | Freq: Every evening | ORAL | Status: DC | PRN

## 2021-09-25 MED ORDER — KETOROLAC TROMETHAMINE 30 MG/ML IJ SOLN: 30.0000 mg | Freq: Four times a day (QID) | INTRAMUSCULAR | Status: DC

## 2021-09-25 MED ORDER — ACETAMINOPHEN 500 MG PO TABS: 1000.0000 mg | ORAL_TABLET | Freq: Four times a day (QID) | ORAL | Status: DC

## 2021-09-25 MED ORDER — SODIUM CHLORIDE (PF) 0.9 % IJ SOLN
INTRAMUSCULAR | Status: AC
  Filled 2021-09-25: qty 50

## 2021-09-25 MED ORDER — OXYCODONE HCL 5 MG PO TABS: 5.0000 mg | ORAL_TABLET | ORAL | Status: AC | PRN

## 2021-09-25 MED ORDER — LACTATED RINGERS IV BOLUS
1000.0000 mL | Freq: Once | INTRAVENOUS | Status: AC
  Administered 2021-09-25: 1000 mL via INTRAVENOUS

## 2021-09-25 MED ORDER — ONDANSETRON HCL 4 MG/2ML IJ SOLN
INTRAMUSCULAR | Status: DC | PRN
  Administered 2021-09-25: 4 mg via INTRAVENOUS

## 2021-09-25 MED ORDER — SOD CITRATE-CITRIC ACID 500-334 MG/5ML PO SOLN
30.0000 mL | ORAL | Status: AC
  Administered 2021-09-25: 30 mL via ORAL

## 2021-09-25 MED ORDER — BUPIVACAINE HCL 0.5 % IJ SOLN
INTRAMUSCULAR | Status: DC | PRN
  Administered 2021-09-25: 20 mL

## 2021-09-25 MED ORDER — WITCH HAZEL-GLYCERIN EX PADS: 1.0000 "application " | MEDICATED_PAD | CUTANEOUS | Status: DC | PRN

## 2021-09-25 MED ORDER — IBUPROFEN 600 MG PO TABS
600.0000 mg | ORAL_TABLET | Freq: Four times a day (QID) | ORAL | Status: DC
  Administered 2021-09-27 (×2): 600 mg via ORAL
  Filled 2021-09-25 (×2): qty 1

## 2021-09-25 MED ORDER — PHENYLEPHRINE HCL-NACL 20-0.9 MG/250ML-% IV SOLN
INTRAVENOUS | Status: DC | PRN
Start: 2021-09-25 — End: 2021-09-25
  Administered 2021-09-25: 50 ug/min via INTRAVENOUS

## 2021-09-25 MED ORDER — ACETAMINOPHEN 500 MG PO TABS: 1000.0000 mg | ORAL_TABLET | ORAL | Status: DC

## 2021-09-25 MED ORDER — BUPIVACAINE LIPOSOME 1.3 % IJ SUSP
INTRAMUSCULAR | Status: AC
  Filled 2021-09-25: qty 20

## 2021-09-25 MED ORDER — SIMETHICONE 80 MG PO CHEW
80.0000 mg | CHEWABLE_TABLET | ORAL | Status: DC | PRN
  Administered 2021-09-25: 80 mg via ORAL
  Filled 2021-09-25: qty 1

## 2021-09-25 MED ORDER — OXYCODONE HCL 5 MG PO TABS: 5.0000 mg | ORAL_TABLET | Freq: Once | ORAL | Status: DC | PRN

## 2021-09-25 MED ORDER — GABAPENTIN 300 MG PO CAPS
300.0000 mg | ORAL_CAPSULE | ORAL | Status: DC
Start: 2021-09-26 — End: 2021-09-25
  Filled 2021-09-25: qty 1

## 2021-09-25 MED ORDER — HYDROMORPHONE HCL 1 MG/ML IJ SOLN: 1.0000 mg | INTRAMUSCULAR | Status: DC | PRN

## 2021-09-25 MED ORDER — PRENATAL MULTIVITAMIN CH
1.0000 | ORAL_TABLET | Freq: Every day | ORAL | Status: DC
  Filled 2021-09-25 (×2): qty 1

## 2021-09-25 MED ORDER — FENTANYL CITRATE (PF) 100 MCG/2ML IJ SOLN: 25.0000 ug | INTRAMUSCULAR | Status: DC | PRN

## 2021-09-25 MED ORDER — COCONUT OIL OIL: 1.0000 "application " | TOPICAL_OIL | Status: DC | PRN

## 2021-09-25 MED ORDER — TERBUTALINE SULFATE 1 MG/ML IJ SOLN
0.2500 mg | Freq: Once | INTRAMUSCULAR | Status: DC
Start: 2021-09-25 — End: 2021-09-25

## 2021-09-25 MED ORDER — LACTATED RINGERS IV SOLN: INTRAVENOUS | Status: DC

## 2021-09-25 MED ORDER — DIPHENHYDRAMINE HCL 50 MG/ML IJ SOLN: 12.5000 mg | INTRAMUSCULAR | Status: DC | PRN

## 2021-09-25 MED ORDER — POVIDONE-IODINE 10 % EX SWAB: 2.0000 "application " | Freq: Once | CUTANEOUS | Status: DC

## 2021-09-25 MED ORDER — BUPIVACAINE IN DEXTROSE 0.75-8.25 % IT SOLN
INTRATHECAL | Status: DC | PRN
  Administered 2021-09-25: 1.6 mL via INTRATHECAL

## 2021-09-25 MED ORDER — ONDANSETRON HCL 4 MG/2ML IJ SOLN
4.0000 mg | Freq: Three times a day (TID) | INTRAMUSCULAR | Status: DC | PRN
  Administered 2021-09-25: 4 mg via INTRAVENOUS
  Filled 2021-09-25: qty 2

## 2021-09-25 MED ORDER — MEPERIDINE HCL 25 MG/ML IJ SOLN: 6.2500 mg | INTRAMUSCULAR | Status: DC | PRN

## 2021-09-25 MED ORDER — SODIUM CHLORIDE 0.9% FLUSH: 3.0000 mL | INTRAVENOUS | Status: DC | PRN

## 2021-09-25 MED ORDER — PHENYLEPHRINE HCL (PRESSORS) 10 MG/ML IV SOLN
INTRAVENOUS | Status: DC | PRN
  Administered 2021-09-25 (×2): 80 ug via INTRAVENOUS

## 2021-09-25 MED ORDER — DIPHENHYDRAMINE HCL 25 MG PO CAPS: 25.0000 mg | ORAL_CAPSULE | Freq: Four times a day (QID) | ORAL | Status: DC | PRN

## 2021-09-25 MED ORDER — OXYTOCIN-SODIUM CHLORIDE 30-0.9 UT/500ML-% IV SOLN
INTRAVENOUS | Status: AC
  Filled 2021-09-25: qty 500

## 2021-09-25 MED ORDER — MENTHOL 3 MG MT LOZG
1.0000 | LOZENGE | OROMUCOSAL | Status: DC | PRN
  Filled 2021-09-25: qty 9

## 2021-09-25 MED ORDER — CEFAZOLIN SODIUM-DEXTROSE 2-4 GM/100ML-% IV SOLN
2.0000 g | INTRAVENOUS | Status: AC
  Administered 2021-09-25: 2 g via INTRAVENOUS

## 2021-09-25 MED ORDER — MORPHINE SULFATE (PF) 0.5 MG/ML IJ SOLN
INTRAMUSCULAR | Status: DC | PRN
  Administered 2021-09-25: 100 ug via INTRATHECAL

## 2021-09-25 MED ORDER — LACTATED RINGERS IV SOLN
INTRAVENOUS | Status: DC | PRN
Start: 2021-09-25 — End: 2021-09-25

## 2021-09-25 MED ORDER — NALOXONE HCL 0.4 MG/ML IJ SOLN: 0.4000 mg | INTRAMUSCULAR | Status: DC | PRN

## 2021-09-25 MED ORDER — SOD CITRATE-CITRIC ACID 500-334 MG/5ML PO SOLN
ORAL | Status: AC
  Filled 2021-09-25: qty 15

## 2021-09-25 MED ORDER — OXYTOCIN-SODIUM CHLORIDE 30-0.9 UT/500ML-% IV SOLN
INTRAVENOUS | Status: AC
  Administered 2021-09-25: 2.5 [IU]/h via INTRAVENOUS
  Filled 2021-09-25: qty 500

## 2021-09-25 MED ORDER — NALOXONE HCL 4 MG/10ML IJ SOLN
1.0000 ug/kg/h | INTRAVENOUS | Status: DC | PRN
  Filled 2021-09-25: qty 5

## 2021-09-25 MED ORDER — FENTANYL CITRATE (PF) 100 MCG/2ML IJ SOLN
INTRAMUSCULAR | Status: AC
  Filled 2021-09-25: qty 2

## 2021-09-25 MED ORDER — MAGNESIUM HYDROXIDE 400 MG/5ML PO SUSP
30.0000 mL | ORAL | Status: DC | PRN
  Filled 2021-09-25: qty 30

## 2021-09-25 MED ORDER — FENTANYL CITRATE (PF) 100 MCG/2ML IJ SOLN
INTRAMUSCULAR | Status: DC | PRN
  Administered 2021-09-25: 15 ug via INTRATHECAL

## 2021-09-25 MED ORDER — DIBUCAINE (PERIANAL) 1 % EX OINT: 1.0000 "application " | TOPICAL_OINTMENT | CUTANEOUS | Status: DC | PRN

## 2021-09-25 MED ORDER — MORPHINE SULFATE (PF) 0.5 MG/ML IJ SOLN
INTRAMUSCULAR | Status: AC
  Filled 2021-09-25: qty 10

## 2021-09-25 MED ORDER — SENSORCAINE 0.25 % 300ML FOR PAIN PUMP OPTIME
INTRAMUSCULAR | Status: DC | PRN
  Administered 2021-09-25: 30 mL

## 2021-09-25 MED ORDER — TRAMADOL HCL 50 MG PO TABS: 50.0000 mg | ORAL_TABLET | Freq: Four times a day (QID) | ORAL | Status: DC | PRN

## 2021-09-25 MED ORDER — SENNOSIDES-DOCUSATE SODIUM 8.6-50 MG PO TABS
2.0000 | ORAL_TABLET | Freq: Every day | ORAL | Status: DC
  Administered 2021-09-26 – 2021-09-27 (×2): 2 via ORAL
  Filled 2021-09-25 (×2): qty 2

## 2021-09-25 MED ORDER — OXYCODONE HCL 5 MG/5ML PO SOLN: 5.0000 mg | Freq: Once | ORAL | Status: DC | PRN

## 2021-09-25 MED ORDER — DIPHENHYDRAMINE HCL 25 MG PO CAPS: 25.0000 mg | ORAL_CAPSULE | ORAL | Status: DC | PRN

## 2021-09-25 MED ORDER — FERROUS SULFATE 325 (65 FE) MG PO TABS
325.0000 mg | ORAL_TABLET | ORAL | Status: DC
  Administered 2021-09-26: 325 mg via ORAL
  Filled 2021-09-25 (×2): qty 1

## 2021-09-25 MED ORDER — GABAPENTIN 300 MG PO CAPS
300.0000 mg | ORAL_CAPSULE | Freq: Two times a day (BID) | ORAL | Status: DC
  Administered 2021-09-25 – 2021-09-27 (×4): 300 mg via ORAL
  Filled 2021-09-25 (×4): qty 1

## 2021-09-25 SURGICAL SUPPLY — 31 items
BACTOSHIELD CHG 4% 4OZ (MISCELLANEOUS) ×1
BAG COUNTER SPONGE SURGICOUNT (BAG) ×2 IMPLANT
CHLORAPREP W/TINT 26 (MISCELLANEOUS) ×4 IMPLANT
DRSG TELFA 3X8 NADH (GAUZE/BANDAGES/DRESSINGS) ×2 IMPLANT
ELECT REM PT RETURN 9FT ADLT (ELECTROSURGICAL) ×2
ELECTRODE REM PT RTRN 9FT ADLT (ELECTROSURGICAL) ×1 IMPLANT
EXTRT SYSTEM ALEXIS 17CM (MISCELLANEOUS)
GAUZE SPONGE 4X4 12PLY STRL (GAUZE/BANDAGES/DRESSINGS) ×2 IMPLANT
GLOVE SURG ENC MOIS LTX SZ6.5 (GLOVE) ×2 IMPLANT
GLOVE SURG UNDER LTX SZ7 (GLOVE) ×2 IMPLANT
GOWN STRL REUS W/ TWL LRG LVL3 (GOWN DISPOSABLE) ×2 IMPLANT
GOWN STRL REUS W/TWL LRG LVL3 (GOWN DISPOSABLE) ×2
KIT TURNOVER KIT A (KITS) ×2 IMPLANT
MANIFOLD NEPTUNE II (INSTRUMENTS) ×2 IMPLANT
MAT PREVALON FULL STRYKER (MISCELLANEOUS) ×2 IMPLANT
NS IRRIG 1000ML POUR BTL (IV SOLUTION) ×2 IMPLANT
PACK C SECTION AR (MISCELLANEOUS) ×2 IMPLANT
PAD DRESSING TELFA 3X8 NADH (GAUZE/BANDAGES/DRESSINGS) ×1 IMPLANT
PAD OB MATERNITY 4.3X12.25 (PERSONAL CARE ITEMS) ×2 IMPLANT
PAD PREP 24X41 OB/GYN DISP (PERSONAL CARE ITEMS) ×2 IMPLANT
RETRACTOR WND ALEXIS-O 25 LRG (MISCELLANEOUS) IMPLANT
RTRCTR WOUND ALEXIS O 25CM LRG (MISCELLANEOUS) ×2
SCRUB CHG 4% DYNA-HEX 4OZ (MISCELLANEOUS) ×1 IMPLANT
SUT MNCRL AB 4-0 PS2 18 (SUTURE) ×2 IMPLANT
SUT PLAIN 2 0 XLH (SUTURE) IMPLANT
SUT PLAIN GUT 0 (SUTURE) ×2 IMPLANT
SUT VIC AB 0 CT1 36 (SUTURE) ×8 IMPLANT
SUT VIC AB 3-0 SH 27 (SUTURE) ×1
SUT VIC AB 3-0 SH 27X BRD (SUTURE) ×1 IMPLANT
SYSTEM CONTND EXTRCTN KII BLLN (MISCELLANEOUS) IMPLANT
WATER STERILE IRR 500ML POUR (IV SOLUTION) ×2 IMPLANT

## 2021-09-25 NOTE — Transfer of Care (Signed)
Immediate Anesthesia Transfer of Care Note ? ?Patient: Sara Burch ? ?Procedure(s) Performed: CESAREAN SECTION ? ?Patient Location: PACU and Mother/Baby ? ?Anesthesia Type:Spinal ? ?Level of Consciousness: awake ? ?Airway & Oxygen Therapy: Patient Spontanous Breathing ? ?Post-op Assessment: Report given to RN ? ?Post vital signs: Reviewed and stable ? ?Last Vitals:  ?Vitals Value Taken Time  ?BP    ?Temp    ?Pulse    ?Resp    ?SpO2    ? ? ?Last Pain: There were no vitals filed for this visit.   ? ?  ? ?Complications: No notable events documented. ?

## 2021-09-25 NOTE — Progress Notes (Signed)
ROB: Thinks that she began leaking fluid around 0500. Notes occasional mild contractions. Nitrazine negative but fern slide positive.  Fetal presentation still feels breech, head to maternal left. Will send to L&D for further eval, ultrasound. Discussed possibility of version if fluid levels still adequate, and will proceed with IOL. Otherwise, will need to proceed with C-section. Patient is GBS+ . ?

## 2021-09-25 NOTE — Anesthesia Preprocedure Evaluation (Signed)
Anesthesia Evaluation  ?Patient identified by MRN, date of birth, ID band ?Patient awake ? ? ? ?Reviewed: ?Allergy & Precautions, NPO status , Patient's Chart, lab work & pertinent test results ? ?History of Anesthesia Complications ?Negative for: history of anesthetic complications ? ?Airway ?Mallampati: III ? ?TM Distance: >3 FB ?Neck ROM: full ? ? ? Dental ? ?(+) Chipped ?  ?Pulmonary ?neg pulmonary ROS,  ?  ?Pulmonary exam normal ? ? ? ? ? ? ? Cardiovascular ?Exercise Tolerance: Good ?(-) hypertension(-) angina(-) Past MI negative cardio ROS ?Normal cardiovascular exam ? ? ?  ?Neuro/Psych ?  ? GI/Hepatic ?negative GI ROS, neg GERD  ,  ?Endo/Other  ?Morbid obesity ? Renal/GU ?  ?negative genitourinary ?  ?Musculoskeletal ? ? Abdominal ?  ?Peds ? Hematology ?negative hematology ROS ?(+)   ?Anesthesia Other Findings ?Breech, ruptured, laboring ? ?Past Medical History: ?No date: Miscarriage ? ?Past Surgical History: ?No date: NO PAST SURGERIES ? ? ? ? Reproductive/Obstetrics ?(+) Pregnancy ? ?  ? ? ? ? ? ? ? ? ? ? ? ? ? ?  ?  ? ? ? ? ? ? ? ? ?Anesthesia Physical ?Anesthesia Plan ? ?ASA: 3 and emergent ? ?Anesthesia Plan: Spinal  ? ?Post-op Pain Management:   ? ?Induction:  ? ?PONV Risk Score and Plan:  ? ?Airway Management Planned: Natural Airway and Nasal Cannula ? ?Additional Equipment:  ? ?Intra-op Plan:  ? ?Post-operative Plan:  ? ?Informed Consent: I have reviewed the patients History and Physical, chart, labs and discussed the procedure including the risks, benefits and alternatives for the proposed anesthesia with the patient or authorized representative who has indicated his/her understanding and acceptance.  ? ? ? ?Dental Advisory Given ? ?Plan Discussed with: Anesthesiologist, CRNA and Surgeon ? ?Anesthesia Plan Comments: (Patient reports no bleeding problems and no anticoagulant use. ? ?Plan for spinal with backup GA ? ?Patient consented for risks of anesthesia including  but not limited to:  ?- adverse reactions to medications ?- damage to eyes, teeth, lips or other oral mucosa ?- nerve damage due to positioning  ?- risk of bleeding, infection and or nerve damage from spinal that could lead to paralysis ?- risk of headache or failed spinal ?- damage to teeth, lips or other oral mucosa ?- sore throat or hoarseness ?- damage to heart, brain, nerves, lungs, other parts of body or loss of life ? ?Patient voiced understanding.)  ? ? ? ? ? ? ?Anesthesia Quick Evaluation ? ?

## 2021-09-25 NOTE — Anesthesia Procedure Notes (Signed)
Spinal ? ?Patient location during procedure: OR ?Start time: 09/25/2021 5:25 PM ?End time: 09/25/2021 5:43 PM ?Reason for block: surgical anesthesia ?Staffing ?Performed: resident/CRNA  ?Anesthesiologist: Piscitello, Precious Haws, MD ?Resident/CRNA: Nelda Marseille, CRNA ?Preanesthetic Checklist ?Completed: patient identified, IV checked, site marked, risks and benefits discussed, surgical consent, monitors and equipment checked, pre-op evaluation and timeout performed ?Spinal Block ?Patient position: sitting ?Prep: ChloraPrep ?Patient monitoring: heart rate, continuous pulse ox, blood pressure and cardiac monitor ?Approach: midline ?Location: L3-4 ?Injection technique: single-shot ?Needle ?Needle type: Whitacre and Introducer  ?Needle gauge: 25 G ?Needle length: 9 cm ?Assessment ?Sensory level: T3 ?Events: CSF return ?Additional Notes ?Sterile aseptic technique used throughout the procedure.  Negative paresthesia. Negative blood return. Positive free-flowing CSF. Expiration date of kit checked and confirmed. Patient tolerated procedure well, without complications. ? ? ? ? ? ?

## 2021-09-25 NOTE — Progress Notes (Signed)
ROB: She had some leakage of vaginal fluid around 5 am. She has been going to the bathroom more frequently, not sure if it is pee of fluid. ?

## 2021-09-25 NOTE — Op Note (Signed)
Cesarean Section Procedure Note ? ?Indications: malpresentation: frank breech, with premature rupture of membranes ? ?Pre-operative Diagnosis: 38 week 2 day pregnancy, premature ruptured membranes at term, frank breech presentation, undesired fertility, moderate obesity (BMI 39), history of fetal macrosomia. ? ?Post-operative Diagnosis: Same ? ?Surgeon: Rubie Maid, MD ? ?Assistants:   Jeannie Fend, MD.  An experienced assistant was required given the standard of surgical care given the complexity of the case.  This assistant was needed for exposure, dissection, suctioning, retraction, instrument exchange, and for overall help during the procedure. ? ?Procedure: Primary low transverse Cesarean Section with Bilateral Tubal Ligation ? ?Anesthesia: Spinal anesthesia ? ?Findings: ?Female infant, cephalic presentation, [id number] grams, with Apgar scores of 2 at one minute and 8 at five minutes. ?Intact placenta with 3 vessel cord. Nuchal cord reduced after delivery of fetus.  ?No amniotic fluid present at hysterotomy ?The uterine outline, tubes and ovaries appeared normal.  ? ?Procedure Details: ?The patient was seen in the Holding Room. The risks, benefits, complications, treatment options, and expected outcomes were discussed with the patient.  The patient concurred with the proposed plan, giving informed consent.  The site of surgery properly noted/marked. The patient was taken to the Operating Room, identified as Sara Burch and the procedure verified as C-Section Delivery.  ? ?After induction of anesthesia, the patient was draped and prepped in the usual sterile manner. A Time Out was held and the above information confirmed.  Anesthesia was tested and noted to be adequate. A Pfannenstiel incision was made and carried down through the subcutaneous tissue to the fascia. Fascial incision was made and extended transversely. The fascia was separated from the underlying rectus tissue superiorly and inferiorly. The peritoneum  was identified and entered. Peritoneal incision was extended longitudinally. The surgical assist was able to provide retraction to allow for clear visualization of surgical site. The utero-vesical peritoneal reflection was incised transversely and the bladder flap was bluntly freed from the lower uterine segment. A low transverse uterine incision was made. Delivered from breech (frank) presentation was a 2840 gram Female with Apgar scores of 2 at one minute and 8 at five minutes.  The assistant was able to apply adequate fundal pressure to allow for successful delivery of the fetus. After the umbilical cord was clamped and cut, cord blood was obtained for evaluation. The placenta was removed intact and appeared normal. The uterus was exteriorized and cleared of all clots and debris. The uterine outline, tubes and ovaries appeared normal.  The uterine incision was closed with running locked sutures of 0-Vicryl.  One figure-of-eight  suture was placed at the midline of the incision that was noted to have some oozing. Hemostasis was observed.  ? ?Attention was then turned to the fallopian tubes, and where the patient's left fallopian tube was identified and grasped with a Babcock clamp.  The tube was then followed out to the vimbria.  The Babcock clamp was then used to grasp the tube approximately 4 cm from the cornual region.  A 3 cm segment of tube was then ligated with a free tie of 0-Chromic using the [place] method and excised.  The right fallopian tube was then ligated in a similar fashion and excised. The tubal lumens were cauterized bilaterally.  Good hemostasis was noted with bilateral fallopian tubes. The uterus was then returned to the abdomen.  ? ?The pericolic gutters were cleared of all clots and debris. The fascia was then grasped with Fransisca Kaufmann and Claiborne Billings clamps, and injected with a total  of 60 ml of 1.3% Exparel solution (20 ml of bupivicaine liposomal mixed with 30 ml of 0.5% Marcaine and diluted in 50 ml  of normal saline).  The fascia was then reapproximated with a running suture of 0-Vicryl. The subcutaneous fat layer was reapproximated with 2-0 Vicryl. The skin was reapproximated with 4-0 Monocryl. The skin and subcutaneous tissues were then injected with an additional 40 ml of the Exparel solution. The incision was covered with steri-strips and a honeycomb dressing.  ? ?Instrument, sponge, and needle counts were correct prior the abdominal closure and at the conclusion of the case.  ? ?Estimated Blood Loss:  500 ml ?     ?Drains: foley catheter to gravity drainage, 100 ml of clear urine at end of the procedure ?        ?Total IV Fluids:   900 ml ? ?Specimens: Segments of bilateral fallopian tubes ?        ?Implants: None ?        ?Complications:  None; patient tolerated the procedure well. ?        ?Disposition: PACU - hemodynamically stable. ?        ?Condition: stable ? ? ?Rubie Maid, MD ?[facility] ? ? ? ? ?

## 2021-09-25 NOTE — H&P (Addendum)
Obstetric Preoperative History and Physical ? ?Sara Burch is a 36 y.o. [id number] with IUP at [redacted]w[redacted]d presenting from the office secondary to ruptured membranes since ~ 0500 this morning with frank breech presentation. Notes occasional contractions, denies vaginal bleeding and reports good fetal movement. History of macrosomia in prior pregnancy.   ? ?Prenatal Course ?Source of Care: [facility] with onset of care at 12 weeks ?Pregnancy complications or risks: ?Patient Active Problem List  ? Diagnosis Date Noted  ? Breech presentation of fetus 09/25/2021  ? Premature rupture of membranes 09/25/2021  ? History of macrosomia in infant in prior pregnancy, currently pregnant 09/25/2021  ? Breech presentation 09/25/2021  ? H/O fibromyalgia 12/14/2019  ? Supervision of normal first pregnancy 06/11/2012  ? Carpal tunnel syndrome, bilateral 05/26/2012  ? Family history of thyroid disorder 01/03/2012  ? ?She plans to breastfeed ?She desires no method for postpartum contraception.  ? ?Prenatal labs and studies: ?ABO, Rh: A/Positive/-- 04/23/2023 1010) ?Antibody: Negative April 23, 2023 1010) ?Rubella: 8.94 April 23, 2023 1010) ?RPR: Non Reactive (01/12 1018)  ?HBsAg: Negative 04-23-2023 1010)  ?HIV: Non Reactive 04-23-23 1010)  ?YOV:ZCHYIFOY/-- (03/07 1004) ?1 hr Glucola  normal ?Genetic screening normal ?Anatomy US normal ? ? ? ?Past Medical History:  ?Diagnosis Date  ? Miscarriage   ? ? ?Past Surgical History:  ?Procedure Laterality Date  ? NO PAST SURGERIES    ? ? ?OB History  ?Gravida Para Term Preterm AB Living  ?5 1 1   3 1   ?SAB IAB Ectopic Multiple Live Births  ?2       1  ?  ?# Outcome Date GA Lbr Len/2nd Weight Sex Delivery Anes PTL Lv  ?5 Current           ?4 SAB 2022          ?3 SAB 2022          ?2 Term 2014   4132 g  Vag-Spont   LIV  ?1 AB 2004          ? ? ?Social History  ? ?Socioeconomic History  ? Marital status: Single  ?  Spouse name: Not on file  ? Number of children: Not on file  ? Years of education: Not on file  ?  Highest education level: Not on file  ?Occupational History  ? Not on file  ?Tobacco Use  ? Smoking status: Never  ?  Passive exposure: Never  ? Smokeless tobacco: Never  ?Vaping Use  ? Vaping Use: Never used  ?Substance and Sexual Activity  ? Alcohol use: Not Currently  ?  Comment: rare  ? Drug use: No  ? Sexual activity: Yes  ?  Birth control/protection: None  ?Other Topics Concern  ? Not on file  ?Social History Narrative  ? Not on file  ? ?Social Determinants of Health  ? ?Financial Resource Strain: Not on file  ?Food Insecurity: Not on file  ?Transportation Needs: Not on file  ?Physical Activity: Not on file  ?Stress: Not on file  ?Social Connections: Not on file  ? ? ?Family History  ?Problem Relation Age of Onset  ? Healthy Mother   ? Diabetes Father   ? Breast cancer Neg Hx   ? Ovarian cancer Neg Hx   ? Colon cancer Neg Hx   ? ? ?Medications Prior to Admission  ?Medication Sig Dispense Refill Last Dose  ? b complex vitamins capsule Take 1 capsule by mouth daily. Every other day     ? Cetirizine  HCl (ZYRTEC PO) Take by mouth.     ? Cod Liver Oil 10 MINIM CAPS Take by mouth.     ? Prenatal Vit-Fe Fumarate-FA (MULTIVITAMIN-PRENATAL) 27-0.8 MG TABS tablet Take 1 tablet by mouth daily at 12 noon.     ? ? ?No Known Allergies ? ?Review of Systems: Negative except for what is mentioned in HPI. ? ?Physical Exam: ?BP 135/71   Pulse 84   Resp 19   LMP 12/31/2020 (Exact Date)   SpO2 97%  ?FHR: 135 bpm.  Moderate variability. Decels absent. Accels present.   ?Toco: uterine irritability with occasional contraction.  ? ?GENERAL: Well-developed, well-nourished female in no acute distress.  ?LUNGS: Clear to auscultation bilaterally.  ?HEART: Regular rate and rhythm. ?ABDOMEN: Soft, nontender, nondistended, gravid,  ?PELVIC: 2/40/-3.  ?EXTREMITIES: Nontender, no edema, 2+ distal pulses. ?  ?Pertinent Labs/Studies:   ?Results for orders placed or performed during the hospital encounter of 09/25/21 (from the past 72  hour(s))  ?ROM Plus ([facility] only)     Status: None  ? Collection Time: 09/25/21 11:21 AM  ?Result Value Ref Range  ? Rom Plus POSITIVE   ?  Comment: Performed at [facility], [address], [place], [place] [address]  ?CBC     Status: None  ? Collection Time: 09/25/21  3:25 PM  ?Result Value Ref Range  ? WBC 7.2 4.0 - 10.5 K/uL  ? RBC 4.34 3.87 - 5.11 MIL/uL  ? Hemoglobin 12.6 12.0 - 15.0 g/dL  ? HCT 38.7 36.0 - 46.0 %  ? MCV 89.2 80.0 - 100.0 fL  ? MCH 29.0 26.0 - 34.0 pg  ? MCHC 32.6 30.0 - 36.0 g/dL  ? RDW 15.5 11.5 - 15.5 %  ? Platelets 181 150 - 400 K/uL  ? nRBC 0.0 0.0 - 0.2 %  ?  Comment: Performed at [facility], [address], [place], [place] [address]  ?Type and screen     Status: None  ? Collection Time: 09/25/21  4:01 PM  ?Result Value Ref Range  ? ABO/RH(D) A POS   ? Antibody Screen NEG   ? Sample Expiration    ?  09/28/2021,2359 ?Performed at [facility], [address], [place], [place] [address] ?  ?ABO/Rh     Status: None  ? Collection Time: 09/25/21  5:19 PM  ?Result Value Ref Range  ? ABO/RH(D)    ?  A POS ?Performed at [facility], [address], [place], [place] [address] ?  ? ? ?Imaging:  ?US OB Limited ?CLINICAL DATA:  Assess amniotic fluid ? ?EXAM: ?LIMITED OBSTETRIC ULTRASOUND ? ?COMPARISON:  09/17/2021 ? ?FINDINGS: ?Number of Fetuses: 1 ? ?Heart Rate:  143 bpm ? ?Movement: Yes ? ?Presentation: Breech ? ?Placental Location: Anterior ? ?Previa: No ? ?Amniotic Fluid (Subjective):  Decreased ? ?AFI: 3.2 cm ? ?BPD: 8.9 cm 36 w  0 d ? ?FL: 7.4 cm 37 w  5 d ? ?MATERNAL FINDINGS: ? ?Cervix:  Appears closed. ? ?Uterus/Adnexae: No abnormality visualized. ? ?IMPRESSION: ?1. Single live intrauterine gestation in breech presentation. ?2. Oligohydramnios with AFI of 3.2 cm. ? ?This exam is performed on an emergent basis and does not ?comprehensively evaluate fetal size, dating, or anatomy; follow-up ?complete OB US should be considered if further fetal  assessment is ?warranted. ? ?Per technologist note, ordering MD present in room during exam. ? ?Electronically Signed ?  By: Duanne Guess D.O. ?  On: 09/25/2021 12:24 ? ? ? ?Assessment  and Plan :Montez MoritaBobbi Ortwein is a 36 y.o. G5P1031 at 255w2d being admitted  for PROM, still in breech presentation.  Initial plan was to consider possible version with IOL if AFI allowed.  However, oligohydramnios noted on scan today. Will now need to proceed with cesarean section delivery . The patient is understanding of the planned procedure and is aware of and accepting of all surgical risks, including but not limited to: bleeding which may require transfusion or reoperation; infection which may require antibiotics; injury to bowel, bladder, ureters or other surrounding organs which may require repair; injury to the fetus; need for additional procedures including hysterectomy in the event of life-threatening complications; placental abnormalities wth subsequent pregnancies; incisional problems; blood clot disorders which may require blood thinners;, and other postoperative/anesthesia complications. The patient is in agreement with the proposed plan, and gives informed written consent for the procedure. All questions have been answered. ? ?Patient now also desires permanent sterilization as she is planning to have surgery.  Other reversible forms of contraception were discussed with patient; she declines all other modalities. Risks of procedure discussed with patient including but not limited to: risk of regret, permanence of method, bleeding, infection, injury to surrounding organs and need for additional procedures.  Failure risk of about 1% with increased risk of ectopic gestation if pregnancy occurs was also discussed with patient.  Also discussed possibility of post-tubal pain syndrome. Patient verbalized understanding of these risks and wants to proceed with sterilization.  Written informed consent obtained.  To OR when  ready. ? ? ? ?Hildred Laserherry, Hao Dion, MD ?[facility] ? ?

## 2021-09-26 ENCOUNTER — Encounter: Payer: Self-pay | Admitting: Obstetrics and Gynecology

## 2021-09-26 LAB — CBC
HCT: 32.5 % — ABNORMAL LOW (ref 36.0–46.0)
Hemoglobin: 10.5 g/dL — ABNORMAL LOW (ref 12.0–15.0)
MCH: 29 pg (ref 26.0–34.0)
MCHC: 32.3 g/dL (ref 30.0–36.0)
MCV: 89.8 fL (ref 80.0–100.0)
Platelets: 149 10*3/uL — ABNORMAL LOW (ref 150–400)
RBC: 3.62 MIL/uL — ABNORMAL LOW (ref 3.87–5.11)
RDW: 15.6 % — ABNORMAL HIGH (ref 11.5–15.5)
WBC: 9.8 10*3/uL (ref 4.0–10.5)
nRBC: 0 % (ref 0.0–0.2)

## 2021-09-26 LAB — RPR: RPR Ser Ql: NONREACTIVE

## 2021-09-26 NOTE — Anesthesia Post-op Follow-up Note (Signed)
?  Anesthesia Pain Follow-up Note ? ?Patient: Sara Burch ? ?Day #: 1 ? ?Date of Follow-up: 09/26/2021 Time: 7:45 AM ? ?Last Vitals:  ?Vitals:  ? 09/26/21 0510 09/26/21 0654  ?BP:    ?Pulse:    ?Resp:    ?Temp:    ?SpO2: 99% 97%  ? ? ?Level of Consciousness: alert ? ?Pain: none  ? ?Side Effects:None ? ?Catheter Site Exam:clean, dry, no drainage ? ? ? ? ?Plan: D/C from anesthesia care at surgeon's request ? ?[name] ? ? ? ?

## 2021-09-26 NOTE — Anesthesia Postprocedure Evaluation (Signed)
Anesthesia Post Note ? ?Patient: Sara Burch ? ?Procedure(s) Performed: CESAREAN SECTION ? ?Patient location during evaluation: Mother Baby ?Anesthesia Type: Spinal ?Level of consciousness: oriented and awake and alert ?Pain management: pain level controlled ?Vital Signs Assessment: post-procedure vital signs reviewed and stable ?Respiratory status: spontaneous breathing and respiratory function stable ?Cardiovascular status: blood pressure returned to baseline and stable ?Postop Assessment: no headache, no backache, no apparent nausea or vomiting and able to ambulate ?Anesthetic complications: no ? ? ?No notable events documented. ? ? ?Last Vitals:  ?Vitals:  ? 09/26/21 0510 09/26/21 0654  ?BP:    ?Pulse:    ?Resp:    ?Temp:    ?SpO2: 99% 97%  ?  ?Last Pain:  ?Vitals:  ? 09/26/21 0315  ?TempSrc: Oral  ?PainSc: 0-No pain  ? ? ?  ?  ?  ?  ?  ?  ? ?[name] ? ? ? ? ?

## 2021-09-26 NOTE — Lactation Note (Signed)
This note was copied from a baby's chart. ?Lactation Consultation Note ? ?Patient Name: Sara Burch ?Today's Date: 09/26/2021 ?Reason for consult: Initial assessment;Early term 37-38.6wks;Other (Comment) (c-section; breech) ?Age:36 hours ? ?Initial lactation visit for P2, c-section 16hrs ago d/t breech presentation and SROM. ? ?Maternal Data ?Has patient been taught Hand Expression?: Yes ?Does the patient have breastfeeding experience prior to this delivery?: Yes ?How long did the patient breastfeed?: 10 months ?Mom stated that supplementation began early with first baby. She was told she did not have enough milk for adequate growth. She continued to BF and then give formula. ? ?Feeding ?Mother's Current Feeding Choice: Breast Milk ? ?Baby has been exclusively BF so far. Output noted, mom voices no pain/discomfort when baby is at the breast. ? ?LATCH Score ?Latch: Too sleepy or reluctant, no latch achieved, no sucking elicited. ?Baby was skin to skin with dad, sleeping soundly. ? ?Lactation Tools Discussed/Used ?  ? ?Interventions ?Interventions: Breast feeding basics reviewed;Skin to skin;Hand express;Coconut oil;Education ? ?Reviewed with parents newborn stomach size, feeding patterns and behaviors, feeding with early cues and not watching the clock and monitoring output. Reviewed and taught hand expression. Encouraged 8-12 attempts in first 24 hours, but baby may not eat each time, encouraged hand expression for stimulation if baby is sleepy.  ?Coconut oil to be used for soreness/tenderness.  ? ?Discharge ?  ? ?Consult Status ?Consult Status: Follow-up ?Date: 09/26/21 ?Follow-up type: In-patient ? ?Mom to call at next feeding attempt for observation by [facility]. ? ?[name] ?09/26/2021, 10:20 AM ? ? ? ?

## 2021-09-26 NOTE — Progress Notes (Signed)
Postpartum Day # 1: Cesarean Delivery (primary, for breech presentation) with BTL ? ?Subjective: ?Patient reports tolerating PO, + flatus, and no problems voiding.  Ambulating without difficulty.   ? ?Objective: ?Vital signs in last 24 hours: ?Temp:  [97.4 ?F (36.3 ?C)-98.2 ?F (36.8 ?C)] 98.2 ?F (36.8 ?C) (03/23 3009) ?Pulse Rate:  [76-103] 89 (03/23 2330) ?Resp:  [18-26] 20 (03/23 0762) ?BP: (110-148)/(58-90) 110/68 (03/23 2633) ?SpO2:  [96 %-100 %] 100 % (03/23 3545) ?Weight:  [111.7 kg] 111.7 kg (03/22 1945) ? ?Physical Exam:  ?General: alert and no distress ?Lungs: clear to auscultation bilaterally ?Breasts: normal appearance, no masses or tenderness ?Heart: regular rate and rhythm, S1, S2 normal, no murmur, click, rub or gallop ?Abdomen: soft, non-tender; bowel sounds normal; no masses,  no organomegaly ?Pelvis: Lochia appropriate, Uterine Fundus firm, Incision: healing well, no significant drainage, no dehiscence.  Small amount of blood on bandage. ?Extremities: DVT Evaluation: No evidence of DVT seen on physical exam. No cords or calf tenderness. No significant calf/ankle edema. ? ?Recent Labs  ?  09/25/21 ?1525 09/26/21 ?0313  ?HGB 12.6 10.5*  ?HCT 38.7 32.5*  ? ? ?Assessment/Plan: ?Status post Cesarean section. Doing well postoperatively.  ?Breastfeeding, Lactation consult as needed.  ?Circumcision prior to discharge. ?Contraception: tubal ligation ?Regular diet ?Continue PO pain management ?Continue current care. Plan for discharge tomorrow. ? ?Hildred Laser, MD ?[facility] ? ? ? ? ? ?

## 2021-09-27 LAB — SURGICAL PATHOLOGY

## 2021-09-27 MED ORDER — IBUPROFEN 600 MG PO TABS: 600.0000 mg | ORAL_TABLET | Freq: Four times a day (QID) | ORAL | 1 refills | Status: DC | PRN

## 2021-09-27 MED ORDER — OXYCODONE-ACETAMINOPHEN 5-325 MG PO TABS: 1.0000 | ORAL_TABLET | Freq: Four times a day (QID) | ORAL | 0 refills | Status: DC | PRN

## 2021-09-27 NOTE — Discharge Summary (Signed)
? ?  Postpartum Discharge Summary ? ? ?   ?Patient Name: Sara Burch ?DOB: Nov 20, 1985 ?MRN: [id number] ? ?Date of admission: 09/25/2021 ?Delivery date:09/25/2021  ?Delivering provider: Rubie Maid  ?Date of discharge: 09/27/2021 ? ?Admitting diagnosis: Breech presentation [O32.1XX0] ?Intrauterine pregnancy: [redacted]w[redacted]d    ?Secondary diagnosis:  Principal Problem: ?  Premature rupture of membranes ?Active Problems: ?  Breech presentation of fetus ?  History of macrosomia in infant in prior pregnancy, currently pregnant ?  Breech presentation ?  Obesity in pregnancy ?  Unwanted fertility ? ?Additional problems: None    ?Discharge diagnosis: Term Pregnancy Delivered                                              ?Post partum procedures: None ?Augmentation: N/A ?Complications: None ? ?Hospital course: Sceduled C/S   36y.o. yo [id number] admitted to the hospital 09/25/2021 for cesarean section with the following indication:Malpresentation with PROM.Delivery details are as follows:  ?Membrane Rupture Time/Date: 0500  ,09/25/2021   ?Delivery Method:C-Section, Low Transverse  ?Details of operation can be found in separate operative note.  Patient had an uncomplicated postpartum course.  She is ambulating, tolerating a regular diet, passing flatus, and urinating well. Patient is discharged home in stable condition on  09/28/21 ?       ?Newborn Data: ?Birth date:09/[birth year]/20[birth year]  ?Birth time:5:59 PM  ?Gender:Female  ?Living status:Living  ?Apgars:2 ,8  ?Weight:2840 g    ? ?Magnesium Sulfate received: No ?BMZ received: No ?Rhophylac: N/A ?MMR:No ?T-DaP:Given prenatally ?Flu: No. Declined.  ?Transfusion:No ? ?Physical exam  ?Vitals:  ? 09/26/21 [id number] 09/26/21 2338 09/27/21 0747  ?BP: 110/68 128/78 (!) 106/59 127/80  ?Pulse: 89 (!) 103 90 91  ?Resp: _0 ?Temp: 98.2 ?F (36.8 ?C) 98.7 ?F (37.1 ?C) 98.5 ?F (36.9 ?C) 97.9 ?F (36.6 ?C)  ?TempSrc: Oral Oral Oral Oral  ?SpO2: 100% 99% 100% 100%  ?Weight:      ?Height:       ? ?General: alert and no distress ?Lochia: appropriate ?Uterine Fundus: firm ?Incision: Healing well with no significant drainage, No significant erythema, Dressing with light staining, dry, and intact ?DVT Evaluation: No evidence of DVT seen on physical exam. Negative Homan's sign.  No cords or calf tenderness.  No significant calf/ankle edema. ? ? ?Labs: ?Lab Results  ?Component Value Date  ? WBC 9.8 09/26/2021  ? HGB 10.5 (L) 09/26/2021  ? HCT 32.5 (L) 09/26/2021  ? MCV 89.8 09/26/2021  ? PLT 149 (L) 09/26/2021  ? ? ? ?Edinburgh Score: ? ?  09/26/2021  ?  8:49 AM  ?EdFlavia Shipperostnatal Depression Scale Screening Tool  ?I have been able to laugh and see the funny side of things. 0  ?I have looked forward with enjoyment to things. 1  ?I have blamed myself unnecessarily when things went wrong. 1  ?I have been anxious or worried for no good reason. 0  ?I have felt scared or panicky for no good reason. 0  ?Things have been getting on top of me. 1  ?I have been so unhappy that I have had difficulty sleeping. 0  ?I have felt sad or miserable. 1  ?I have been so unhappy that I have been crying. 0  ?The thought of harming myself has occurred to me. 0  ?Edinburgh Postnatal Depression Scale  Total 4  ? ? ? ? ?After visit meds:  ?Allergies as of 09/27/2021   ?No Known Allergies ?  ? ?  ?Medication List  ?  ? ?TAKE these medications   ? ?b complex vitamins capsule ?Take 1 capsule by mouth daily. Every other day ?  ?Cod Liver Oil 10 MINIM Caps ?Take by mouth. ?  ?ibuprofen 600 MG tablet ?Commonly known as: ADVIL ?Take 1 tablet (600 mg total) by mouth every 6 (six) hours as needed. ?  ?multivitamin-prenatal 27-0.8 MG Tabs tablet ?Take 1 tablet by mouth daily at 12 noon. ?  ?oxyCODONE-acetaminophen 5-325 MG tablet ?Commonly known as: Percocet ?Take 1-2 tablets by mouth every 6 (six) hours as needed for severe pain or moderate pain. ?  ?ZYRTEC PO ?Take by mouth. ?  ? ?  ? ? ? ?Discharge home in stable condition ?Infant Feeding:  Breast ?Infant Disposition:home with mother ?Discharge instruction: per After Visit Summary and Postpartum booklet. ?Activity: Advance as tolerated. Pelvic rest for 6 weeks.  ?Diet: routine diet ?Anticipated Birth Control: BTL done PP ?Postpartum Appointment:6 weeks ?Additional Postpartum F/U: Postpartum Depression checkup and Incision check 1 week ?Future Appointments: ?Future Appointments  ?Date Time Provider [place]  ?10/01/2021 11:30 AM Rubie Maid, MD [facility] None  ?12/06/2021  8:30 AM [facility] NURSE [facility] None  ?12/12/2021 11:15 AM Sable Feil, PA-C [facility] None  ? ?Follow up Visit: ? Follow-up Information   ? ? Rubie Maid, MD Follow up.   ?Specialties: Obstetrics and Gynecology, Radiology ?Why: 2 weeks mood check televisit ?6 weeks postpartum visit ?Contact information: ?[address] ?[address] ?[place] [address] ?[phone] ? ? ?  ?  ? ?  ?  ? ?  ? ? ? ?  ? ?09/27/2021 ?Rubie Maid, MD ? ? ?

## 2021-09-27 NOTE — Progress Notes (Signed)
Postpartum Day # 2: Cesarean Delivery (primary, for breech presentation) with BTL ? ?Subjective: ?Patient reports tolerating PO, + flatus, and no problems voiding.  Pain more intense today. Ambulating without difficulty.   ? ?Objective: ?Vitals:  ? 09/26/21 3785 09/26/21 1744 09/26/21 2338 09/27/21 0747  ?BP: 110/68 128/78 (!) 106/59 127/80  ?Pulse: 89 (!) 103 90 91  ?Resp: 20 18 18 18   ?Temp: 98.2 ?F (36.8 ?C) 98.7 ?F (37.1 ?C) 98.5 ?F (36.9 ?C) 97.9 ?F (36.6 ?C)  ?TempSrc: Oral Oral Oral Oral  ?SpO2: 100% 99% 100% 100%  ?Weight:      ?Height:      ? ? ?Physical Exam:  ?General: alert and no distress ?Lungs: clear to auscultation bilaterally ?Breasts: normal appearance, no masses or tenderness ?Heart: regular rate and rhythm, S1, S2 normal, no murmur, click, rub or gallop ?Abdomen: soft, non-tender; bowel sounds normal; no masses,  no organomegaly ?Pelvis: Lochia appropriate, Uterine Fundus firm, Incision: healing well, no significant drainage, no dehiscence.  Small amount of blood on bandage. ?Extremities: DVT Evaluation: No evidence of DVT seen on physical exam. No cords or calf tenderness. No significant calf/ankle edema. ? ?Recent Labs  ?  09/25/21 ?1525 09/26/21 ?0313  ?HGB 12.6 10.5*  ?HCT 38.7 32.5*  ? ? ?Assessment/Plan: ?Status post Cesarean section. Doing well postoperatively.  ?Breastfeeding, Lactation consult as needed.  ?Circumcision prior to discharge. ?Contraception: tubal ligation ?Regular diet ?Continue PO pain management ? Plan for discharge today. ? ?09/28/21, MD ?[facility] ? ? ? ? ? ?

## 2021-09-30 ENCOUNTER — Other Ambulatory Visit: Payer: Self-pay

## 2021-10-01 ENCOUNTER — Ambulatory Visit (INDEPENDENT_AMBULATORY_CARE_PROVIDER_SITE_OTHER): Payer: 59 | Admitting: Obstetrics and Gynecology

## 2021-10-01 ENCOUNTER — Encounter: Payer: Self-pay | Admitting: Obstetrics and Gynecology

## 2021-10-01 ENCOUNTER — Other Ambulatory Visit: Payer: Self-pay

## 2021-10-01 VITALS — BP 139/83 | HR 91 | Resp 16 | Ht 66.0 in | Wt 240.4 lb

## 2021-10-01 DIAGNOSIS — Z4889 Encounter for other specified surgical aftercare: Secondary | ICD-10-CM

## 2021-10-01 DIAGNOSIS — Z98891 History of uterine scar from previous surgery: Secondary | ICD-10-CM

## 2021-10-01 NOTE — Patient Instructions (Signed)
Wound Care, Adult ?Taking care of your wound properly can help to prevent pain, infection, and scarring. It can also help your wound heal more quickly. Follow instructions from your health care provider about how to care for your wound. ?Supplies needed: ?Soap and water. ?Wound cleanser, saline, or germ-free (sterile) water. ?Gauze. ?If needed, a clean bandage (dressing) or other type of wound dressing material to cover or place in the wound. Follow your health care provider's instructions about what dressing supplies to use. ?Cream or topical ointment to apply to the wound, if told by your health care provider. ?How to care for your wound ?Cleaning the wound ?Ask your health care provider how to clean the wound. This may include: ?Using mild soap and water, a wound cleanser, saline, or sterile water. ?Using a clean gauze to pat the wound dry after cleaning it. Do not rub or scrub the wound. ?Dressing care ?Wash your hands with soap and water for at least 20 seconds before and after you change the dressing. If soap and water are not available, use hand sanitizer. ?Change your dressing as told by your health care provider. This may include: ?Cleaning or rinsing out (irrigating) the wound. ?Application of cream or topical ointment, if told by your health care provider. ?Placing a dressing over the wound or in the wound (packing). ?Covering the wound with an outer dressing. ?Leave stitches (sutures), staples, skin glue, or adhesive strips in place. These skin closures may need to stay in place for 2 weeks or longer. If adhesive strip edges start to loosen and curl up, you may trim the loose edges. Do not remove adhesive strips completely unless your health care provider tells you to do that. ?Ask your health care provider when you can leave the wound uncovered. ?Checking for infection ?Check your wound area every day for signs of infection. Check for: ?More redness, swelling, or pain. ?Fluid or blood. ?Warmth. ?Pus or  a bad smell. ? ?Follow these instructions at home ?Medicines ?If you were prescribed an antibiotic medicine, cream, or ointment, take or apply it as told by your health care provider. Do not stop using the antibiotic even if your condition improves. ?If you were prescribed pain medicine, take it 30 minutes before you do any wound care or as told by your health care provider. ?Take over-the-counter and prescription medicines only as told by your health care provider. ?Eating and drinking ?Eat a diet that includes protein, vitamin A, vitamin C, and other nutrient-rich foods to help the wound heal. ?Foods rich in protein include meat, fish, eggs, dairy, beans, and nuts. ?Foods rich in vitamin A include carrots and dark green, leafy vegetables. ?Foods rich in vitamin C include citrus fruits, tomatoes, broccoli, and peppers. ?Drink enough fluid to keep your urine pale yellow. ?General instructions ?Do not take baths, swim, or use a hot tub until your health care provider approves. Ask your health care provider if you may take showers. You may only be allowed to take sponge baths. ?Do not scratch or pick at the wound. Keep it covered as told by your health care provider. ?Return to your normal activities as told by your health care provider. Ask your health care provider what activities are safe for you. ?Protect your wound from the sun when you are outside for the first 6 months, or for as long as told by your health care provider. Cover up the scar area or apply sunscreen that has an SPF of at least 30. ?Do not   use any products that contain nicotine or tobacco. These products include cigarettes, chewing tobacco, and vaping devices, such as e-cigarettes. If you need help quitting, ask your health care provider. ?Keep all follow-up visits. This is important. ?Contact a health care provider if: ?You received a tetanus shot and you have swelling, severe pain, redness, or bleeding at the injection site. ?Your pain is not  controlled with medicine. ?You have any of these signs of infection: ?More redness, swelling, or pain around the wound. ?Fluid or blood coming from the wound. ?Warmth coming from the wound. ?A fever or chills. ?You are nauseous or you vomit. ?You are dizzy. ?You have a new rash or hardness around the wound. ?Get help right away if: ?You have a red streak of skin near the area around your wound. ?Pus or a bad smell coming from the wound. ?Your wound has been closed with staples, sutures, skin glue, or adhesive strips and it begins to open up and separate. ?Your wound is bleeding, and the bleeding does not stop with gentle pressure. ?These symptoms may represent a serious problem that is an emergency. Do not wait to see if the symptoms will go away. Get medical help right away. Call your local emergency services (911 in the [place] Do not drive yourself to the hospital. ?Summary ?Always wash your hands with soap and water for at least 20 seconds before and after changing your dressing. ?Change your dressing as told by your health care provider. ?To help with healing, eat foods that are rich in protein, vitamin A, vitamin C, and other nutrients. ?Check your wound every day for signs of infection. Contact your health care provider if you think that your wound is infected. ?This information is not intended to replace advice given to you by your health care provider. Make sure you discuss any questions you have with your health care provider. ?Document Revised: 10/30/2020 Document Reviewed: 10/30/2020 ?Elsevier Patient Education ? 2022 [employer] ? ?

## 2021-10-01 NOTE — Progress Notes (Signed)
? ? ?  OBSTETRICS/GYNECOLOGY POST-OPERATIVE CLINIC VISIT ? ?Subjective:  ?  ? Sara Burch is a 36 y.o. [id number] female who presents to the clinic 5 days status post  primary low transverse C-section  for  breech presentation with PROM at 38 gestational weeks . Eating a regular diet without difficulty. Bowel movements are abnormal with mild constipation. Taking stool softener once a day . Pain is controlled with current analgesics. Medications being used: prescription NSAID's including ibuprofen (Motrin) and narcotic analgesics including oxycodone (Oxycontin, Oxyir). ? ?The following portions of the patient's history were reviewed and updated as appropriate: allergies, current medications, past family history, past medical history, past social history, past surgical history, and problem list. ? ?Review of Systems ?Pertinent items noted in HPI and remainder of comprehensive ROS otherwise negative. ?  ?Objective:  ? ?BP 139/83   Pulse 91   Resp 16   Ht 5\' 6"  (1.676 m)   Wt 240 lb 6.4 oz (109 kg)   LMP 12/31/2020 (Exact Date)   Breastfeeding Unknown   BMI 38.80 kg/m?  Body mass index is 38.8 kg/m?. ? ?General:  alert and no distress  ?Abdomen: soft, bowel sounds active, non-tender  ?Incision:   healing well, no drainage, no erythema, no hernia, no seroma, no swelling, no dehiscence, incision well approximated  ? ? ?Pathology:  ?none ? ?Assessment:  ? ?Patient s/p primary low transverse C-section.   ?Doing well postoperatively. ?Mild constipation ?  ?Plan:  ? ?1. Continue any current medications as instructed by provider. Can increase stool softener to twice daily as needed.  ?2. Wound care discussed. ?3. Activity restrictions: no bending, stooping, or squatting and no lifting more than 10-15 pounds and pelvic rest x 6 weeks.  ?4. Anticipated return to work:  6 weeks . ?5. Follow up: 5 weeks for postpartum visit.  Also will schedule postpartum mood check televisit in 1-2 weeks.  ? ? ? ?01/02/2021, MD ?[facility] ? ? ?

## 2021-10-10 ENCOUNTER — Telehealth (INDEPENDENT_AMBULATORY_CARE_PROVIDER_SITE_OTHER): Payer: 59 | Admitting: Obstetrics and Gynecology

## 2021-10-10 ENCOUNTER — Encounter: Payer: Self-pay | Admitting: Obstetrics and Gynecology

## 2021-10-10 VITALS — Ht 66.0 in

## 2021-10-10 DIAGNOSIS — Z1332 Encounter for screening for maternal depression: Secondary | ICD-10-CM

## 2021-10-10 DIAGNOSIS — Z98891 History of uterine scar from previous surgery: Secondary | ICD-10-CM

## 2021-10-10 NOTE — Patient Instructions (Signed)

## 2021-10-10 NOTE — Progress Notes (Signed)
? ?  Virtual Visit via Video Note ? ?I connected with Sara Burch on 10/10/21 at  4:15 PM EDT by a video enabled telemedicine application and verified that I am speaking with the correct person using two identifiers. ? ?Location: ?Patient: Home ?Provider: Office ?  ?I discussed the limitations of evaluation and management by telemedicine and the availability of in person appointments. The patient expressed understanding and agreed to proceed. ? ?  ?History of Present Illness: ?  ?Sara Burch is a 36 y.o. G44P1031 female who presents for a 2 week televisit for mood check. She is 2 weeks postpartum following a primary cesarean delivery for breech presentation with PROM at 38 weeks.  Postpartum course has been well so far. Baby is feeding by breast. Bleeding: is minimal. Postpartum depression screening: negative.  EDPS score is 2.  Has questions if she can use a belly band.  ?  ?  ?The following portions of the patient's history were reviewed and updated as appropriate: allergies, current medications, past family history, past medical history, past social history, past surgical history, and problem list. ?  ?Observations/Objective: ? ? ? ?  10/10/2021  ?  2:47 PM 10/01/2021  ? 11:50 AM 09/27/2021  ?  7:47 AM  ?Vitals with BMI  ?Height 5\' 6"  5\' 6"    ?Weight  240 lbs 6 oz   ?BMI  38.82   ?Systolic  139 127  ?Diastolic  83 80  ?Pulse  91 91  ? ?Gen App: NAD ?Psych: normal speech, affect. Good mood.  ? ? ? ? ?  10/10/2021  ?  2:34 PM 09/26/2021  ?  8:49 AM  ?12/10/2021 Postnatal Depression Scale Screening Tool  ?I have been able to laugh and see the funny side of things. 0 0  ?I have looked forward with enjoyment to things. 1 1  ?I have blamed myself unnecessarily when things went wrong. 0 1  ?I have been anxious or worried for no good reason. 0 0  ?I have felt scared or panicky for no good reason. 0 0  ?Things have been getting on top of me. 0 1  ?I have been so unhappy that I have had difficulty sleeping. 0 0  ?I have felt sad  or miserable. 1 1  ?I have been so unhappy that I have been crying. 0 0  ?The thought of harming myself has occurred to me. 0 0  ?Edinburgh Postnatal Depression Scale Total 2 4  ?  ? ? ? ?Assessment and Plan: ?  ?1. Encounter for screening for maternal depression ?- Screening negative today. Will rescreen at 6 week postpartum visit. Overall doing well.  ?  ?2. Postpartum state ?- Overall doing well. Continue routine postpartum home care. Advised that she can use belly band.  ?  ?3. Lactating mother ?- Doing well.  ?  ?Follow Up Instructions: ? Follow up in 4 weeks for postpartum visit.  ?  ?I discussed the assessment and treatment plan with the patient. The patient was provided an opportunity to ask questions and all were answered. The patient agreed with the plan and demonstrated an understanding of the instructions. ?  ?The patient was advised to call back or seek an in-person evaluation if the symptoms worsen or if the condition fails to improve as anticipated. ?  ?I provided 5 minutes of non-face-to-face time during this encounter. ?  ?  ?09/28/2021, MD ?[facility] ? ?

## 2021-10-10 NOTE — Progress Notes (Signed)
Patient presents for 2 week postpartum mood check. Denies issued today. Edinburgh screen negative.  ? ?Sara Burch, Robinette Haines, CMA ?[facility] ? ?

## 2021-10-28 NOTE — Progress Notes (Signed)
? ?  OBSTETRICS POSTPARTUM CLINIC PROGRESS NOTE ? ?Subjective:  ?  ? Sara Burch is a 36 y.o. G8P1031 female who presents for a postpartum visit. She is 5 weeks postpartum following a low cervical transverse Cesarean section. I have fully reviewed the prenatal and intrapartum course. The delivery was at 38 gestational weeks for breech presentation with PROM.  Anesthesia: spinal. Postpartum course has been good, no complications. Baby's course has been uncomplicated, only concerns with circumcision being postponed. Baby is feeding by breast. Bleeding: patient has not resumed menses, with No LMP recorded.. Bowel function is  abnormal, still has some mild constipation. She is taking stool softeners every other day . Bladder function is normal. Patient is not sexually active. Contraception method is tubal ligation. Postpartum depression screening: negative.  EDPS score is 3.  ? ? ?The following portions of the patient's history were reviewed and updated as appropriate: allergies, current medications, past family history, past medical history, past social history, past surgical history, and problem list. ? ?Review of Systems ?Pertinent items are noted in HPI.  ? ?Objective:  ? ? There were no vitals taken for this visit.  ?General:  alert and no distress  ? Breasts:  inspection negative, no nipple discharge or bleeding, no masses or nodularity palpable  ?Lungs: clear to auscultation bilaterally  ?Heart:  regular rate and rhythm, S1, S2 normal, no murmur, click, rub or gallop  ?Abdomen: soft, non-tender; bowel sounds normal; no masses,  no organomegaly.  Well healed Pfannenstiel incision  ? Vulva:  normal  ?Vagina: normal vagina, no discharge, exudate, lesion, or erythema  ?Cervix:  no cervical motion tenderness and no lesions  ?Corpus: normal size, contour, position, consistency, mobility, non-tender  ?Adnexa:  normal adnexa and no mass, fullness, tenderness  ?Rectal Exam: Not performed.  ?      ? ?Labs:  ?Lab Results   ?Component Value Date  ? HGB 10.5 (L) 09/26/2021  ? ? ? ?Assessment:  ? ?1. Postpartum care following cesarean delivery   ?2. Lactating mother   ?  ? ?Plan:  ? ?1. Contraception: tubal ligation ?2. Lactating, no issues.  ?3. Follow up in:  4-6  months for annual exm, or sooner as needed.  ? ? ?Hildred Laser, MD ?[facility] ? ?

## 2021-10-30 ENCOUNTER — Encounter: Payer: Self-pay | Admitting: Obstetrics and Gynecology

## 2021-10-30 ENCOUNTER — Ambulatory Visit (INDEPENDENT_AMBULATORY_CARE_PROVIDER_SITE_OTHER): Payer: 59 | Admitting: Obstetrics and Gynecology

## 2021-11-01 ENCOUNTER — Other Ambulatory Visit: Payer: Self-pay

## 2021-11-01 ENCOUNTER — Emergency Department
Admission: EM | Admit: 2021-11-01 | Discharge: 2021-11-02 | Disposition: A | Discharge status: Home / Self Care | Payer: 59 | Attending: Emergency Medicine | Admitting: Emergency Medicine

## 2021-11-01 ENCOUNTER — Emergency Department: Payer: 59

## 2021-11-01 DIAGNOSIS — R7401 Elevation of levels of liver transaminase levels: Secondary | ICD-10-CM | POA: Diagnosis not present

## 2021-11-01 DIAGNOSIS — K8050 Calculus of bile duct without cholangitis or cholecystitis without obstruction: Secondary | ICD-10-CM

## 2021-11-01 DIAGNOSIS — R1013 Epigastric pain: Secondary | ICD-10-CM | POA: Diagnosis not present

## 2021-11-01 DIAGNOSIS — K8020 Calculus of gallbladder without cholecystitis without obstruction: Secondary | ICD-10-CM | POA: Insufficient documentation

## 2021-11-01 DIAGNOSIS — R079 Chest pain, unspecified: Secondary | ICD-10-CM | POA: Diagnosis not present

## 2021-11-01 DIAGNOSIS — R0789 Other chest pain: Secondary | ICD-10-CM | POA: Diagnosis not present

## 2021-11-01 DIAGNOSIS — R935 Abnormal findings on diagnostic imaging of other abdominal regions, including retroperitoneum: Secondary | ICD-10-CM | POA: Diagnosis not present

## 2021-11-01 DIAGNOSIS — K760 Fatty (change of) liver, not elsewhere classified: Secondary | ICD-10-CM | POA: Diagnosis not present

## 2021-11-01 DIAGNOSIS — K829 Disease of gallbladder, unspecified: Secondary | ICD-10-CM | POA: Diagnosis not present

## 2021-11-01 LAB — TROPONIN I (HIGH SENSITIVITY)
Troponin I (High Sensitivity): 3 ng/L (ref <18)
Troponin I (High Sensitivity): 4 ng/L (ref <18)

## 2021-11-01 LAB — CBC
HCT: 41.2 % (ref 36.0–46.0)
Hemoglobin: 13.1 g/dL (ref 12.0–15.0)
MCH: 28.5 pg (ref 26.0–34.0)
MCHC: 31.8 g/dL (ref 30.0–36.0)
MCV: 89.6 fL (ref 80.0–100.0)
Platelets: 150 10*3/uL (ref 150–400)
RBC: 4.6 MIL/uL (ref 3.87–5.11)
RDW: 14.3 % (ref 11.5–15.5)
WBC: 5.1 10*3/uL (ref 4.0–10.5)
nRBC: 0 % (ref 0.0–0.2)

## 2021-11-01 LAB — URINALYSIS, ROUTINE W REFLEX MICROSCOPIC
Bacteria, UA: NONE SEEN
Bilirubin Urine: NEGATIVE
Glucose, UA: NEGATIVE mg/dL
Hgb urine dipstick: NEGATIVE
Ketones, ur: NEGATIVE mg/dL
Nitrite: NEGATIVE
Protein, ur: NEGATIVE mg/dL
Specific Gravity, Urine: 1.028 (ref 1.005–1.030)
pH: 6 (ref 5.0–8.0)

## 2021-11-01 LAB — PROTEIN / CREATININE RATIO, URINE
Creatinine, Urine: 201 mg/dL
Protein Creatinine Ratio: 0.08 mg/mg{Cre} (ref 0.00–0.15)
Total Protein, Urine: 16 mg/dL

## 2021-11-01 LAB — BASIC METABOLIC PANEL
Anion gap: 9 (ref 5–15)
BUN: 9 mg/dL (ref 6–20)
CO2: 25 mmol/L (ref 22–32)
Calcium: 9.8 mg/dL (ref 8.9–10.3)
Chloride: 103 mmol/L (ref 98–111)
Creatinine, Ser: 0.59 mg/dL (ref 0.44–1.00)
GFR, Estimated: >60 mL/min (ref >60)
Glucose, Bld: 131 mg/dL — ABNORMAL HIGH (ref 70–99)
Potassium: 4 mmol/L (ref 3.5–5.1)
Sodium: 137 mmol/L (ref 135–145)

## 2021-11-01 LAB — HEPATIC FUNCTION PANEL
ALT: 448 U/L — ABNORMAL HIGH (ref 0–44)
ALT: 734 U/L — ABNORMAL HIGH (ref 0–44)
AST: 607 U/L — ABNORMAL HIGH (ref 15–41)
AST: 517 U/L — ABNORMAL HIGH (ref 15–41)
Albumin: 3.9 g/dL (ref 3.5–5.0)
Albumin: 3.9 g/dL (ref 3.5–5.0)
Alkaline Phosphatase: 83 U/L (ref 38–126)
Alkaline Phosphatase: 99 U/L (ref 38–126)
Bilirubin, Direct: 0.2 mg/dL (ref 0.0–0.2)
Bilirubin, Direct: 0.1 mg/dL (ref 0.0–0.2)
Indirect Bilirubin: 0.7 mg/dL (ref 0.3–0.9)
Indirect Bilirubin: 0.8 mg/dL (ref 0.3–0.9)
Total Bilirubin: 0.9 mg/dL (ref 0.3–1.2)
Total Bilirubin: 0.9 mg/dL (ref 0.3–1.2)
Total Protein: 7.4 g/dL (ref 6.5–8.1)
Total Protein: 7.5 g/dL (ref 6.5–8.1)

## 2021-11-01 LAB — LIPASE, BLOOD: Lipase: 34 U/L (ref 11–51)

## 2021-11-01 LAB — PROTIME-INR
INR: 1 (ref 0.8–1.2)
Prothrombin Time: 12.9 seconds (ref 11.4–15.2)

## 2021-11-01 LAB — ACETAMINOPHEN LEVEL: Acetaminophen (Tylenol), Serum: <10 ug/mL — ABNORMAL LOW (ref 10–30)

## 2021-11-01 MED ORDER — GADOBUTROL 1 MMOL/ML IV SOLN
10.0000 mL | Freq: Once | INTRAVENOUS | Status: AC | PRN
  Administered 2021-11-01: 10 mL via INTRAVENOUS

## 2021-11-01 NOTE — ED Provider Notes (Addendum)
7:55 PM Assumed care for off going team.  ? ?Blood pressure 121/70, pulse 80, temperature 98 ?F (36.7 ?C), resp. rate 18, height 5\' 6"  (1.676 m), weight 105 kg, SpO2 97 %, currently breastfeeding. ? ?See their HPI for full report but in brief patient pending MRI ? ? ?IMPRESSION: ?1. Numerous gallstones layer dependently in the gallbladder. Small ?volume pericholecystic fluid. No wall thickening or hyperenhancement ?of the gallbladder wall. Findings more likely related to underlying ?hepatic dysfunction than gallbladder pathology given the absence of ?wall thickening or enhancement currently. Would correlate with ?symptoms and consider HIDA as clinically warranted. ?2. No current evidence of biliary duct dilation. No ?choledocholithiasis. ?3. Mild to moderate hepatic steatosis. ?  ? ?Patient's pain is now gone.  However given her LFTs elevation she denies any Tylenol or alcohol use.  We discussed admission for trending out LFTs and discussing with the surgery given the concern for reoccurrence of pain.  Will discuss with surgery ? ?I also consider the possibility of this being related to HEENTbut patient's platelets are normal and no anemia. Will add on urine to evaluate for protein. ? ?8:16 PM ?Discussed case with Dr. Christian Mate who stated the patient to be followed up outpatient most likely on Tuesday that she does not have any pain at this time unlikely to represent cholecystitis.  I do not think a HIDA would actually add much information given she is currently nontender. ? ?8:17 PM ?Discussed case with GI who stated that patient could follow-up outpatient and that they could get a hepatitis, Tylenol, INR, repeat CMP ? ?AST is downtrending but ALT is up so difficult to interpret that.  However her INR is negative and on repeat assessment she is tolerated p.o. without significant recurrent pain.  We discussed admission for monitoring her LFTs versus going home and following up outpatient with surgery, GI and  repeating her LFTS early next week.  I do not think this is HEELP given normal pressures and no protein in urine...she states that she would prefer to go home.  She understands that if she develops return of pain that she can come back to the ER and have them done over the weekend that I am here over the week for her.  She expressed understanding and felt comfortable with plan.  We discussed avoiding fatty foods ? ? ? ?  ?Vanessa [place], MD ?11/02/21 0009 ? ?  ?Vanessa [place], MD ?11/02/21 0025 ? ?

## 2021-11-01 NOTE — ED Triage Notes (Addendum)
Patient to ER via POV with complaints of substernal/ epigastric pain that started at 1am. Associated shortness of breath and nausea. Patient one month post partum.  ? ?Reports taking pepto and using the bathroom with improvement. Reports no pain currently.  ?

## 2021-11-01 NOTE — ED Provider Notes (Signed)
? ?[facility] ?Provider Note ? ? ? Event Date/Time  ? First MD Initiated Contact with Patient 11/01/21 [phone]   ?  (approximate) ? ? ?History  ? ?Chest/abdominal pain ? ? ?HPI ? ?Sara Burch is a 36 y.o. female who is 1 month postpartum with no significant past medical history who reports she got up around 1 AM to breast-feed her infant when she developed discomfort in her lower chest/upper abdomen.  She reports she drank some ginger tea try to have a bowel movement because she assumed it was gas.  Eventually it eased off and she went back to sleep.  This occurred 2 more times, did occur again this morning when she sat up.  She did eat [employer] yesterday.  Reports a normal stool this morning and has since then has felt better.  No shortness of breath, no lower extremity swelling.  No fevers.  No vomiting.  No history of abdominal surgery ?  ? ? ?Physical Exam  ? ?Triage Vital Signs: ?ED Triage Vitals  ?Enc Vitals Group  ?   BP 11/01/21 0848 139/79  ?   Pulse Rate 11/01/21 0848 91  ?   Resp 11/01/21 0848 17  ?   Temp 11/01/21 0848 98.2 ?F (36.8 ?C)  ?   Temp Source 11/01/21 0848 Oral  ?   SpO2 11/01/21 0848 98 %  ?   Weight 11/01/21 0846 105 kg (231 lb 7.7 oz)  ?   Height 11/01/21 0846 1.676 m (5\' 6" )  ?   Head Circumference --   ?   Peak Flow --   ?   Pain Score 11/01/21 0845 0  ?   Pain Loc --   ?   Pain Edu? --   ?   Excl. in GC? --   ? ? ?Most recent vital signs: ?Vitals:  ? 11/01/21 0848 11/01/21 1229  ?BP: 139/79 128/81  ?Pulse: 91 74  ?Resp: 17 18  ?Temp: 98.2 ?F (36.8 ?C) 98.5 ?F (36.9 ?C)  ?SpO2: 98% 97%  ? ? ? ?General: Awake, no distress.  ?CV:  Good peripheral perfusion.  Regular rate and rhythm, no significant murmur ?Resp:  Normal effort.  ?Abd:  No distention.  Mild discomfort to palpation right upper quadrant, otherwise reassuring exam ?Other:  No lower extremity swelling ? ? ?ED Results / Procedures / Treatments  ? ?Labs ?(all labs ordered are listed, but only abnormal  results are displayed) ?Labs Reviewed  ?BASIC METABOLIC PANEL - Abnormal; Notable for the following components:  ?    Result Value  ? Glucose, Bld 131 (*)   ? All other components within normal limits  ?HEPATIC FUNCTION PANEL - Abnormal; Notable for the following components:  ? AST 607 (*)   ? ALT 448 (*)   ? All other components within normal limits  ?CBC  ?LIPASE, BLOOD  ?POC URINE PREG, ED  ?TROPONIN I (HIGH SENSITIVITY)  ?TROPONIN I (HIGH SENSITIVITY)  ? ? ? ?EKG ? ?ED ECG REPORT ?I, 11/03/21, the attending physician, personally viewed and interpreted this ECG. ? ?Date: 11/01/2021 ? ?Rhythm: normal sinus rhythm ?QRS Axis: normal ?Intervals: normal ?ST/T Wave abnormalities: normal ?Narrative Interpretation: no evidence of acute ischemia ? ? ? ?RADIOLOGY ?Chest x-ray viewed interpret by me, no acute abnormality ? ? ? ?PROCEDURES: ? ?Critical Care performed:  ? ?Procedures ? ? ?MEDICATIONS ORDERED IN ED: ?Medications - No data to display ? ? ?IMPRESSION / MDM / ASSESSMENT AND PLAN / ED COURSE  ?  I reviewed the triage vital signs and the nursing notes. ? ?Patient presents with lower chest/upper abdominal pain as detailed above.  Differential could include ACS/SCAD although EKG is quite reassuring.  High sensitive troponin is normal. ? ?Gastritis, colitis, cholecystitis/colic is a possibility as well. ? ?At this time the patient is asymptomatic which is reassuring.  Will obtain ultrasound, send LFTs. ? ?Ultrasound demonstrates cholelithiasis and CBD 0.65 cm suspicious for choledocholithiasis given elevated LFTs although T. bili is normal. ? ?Will send for MRCP ? ?I have asked my colleague to follow-up on MRCP results and discuss with GI ? ? ? ? ? ? ? ?  ? ? ?FINAL CLINICAL IMPRESSION(S) / ED DIAGNOSES  ? ?Final diagnoses:  ?Epigastric pain  ?Elevated liver transaminase level  ? ? ? ?Rx / DC Orders  ? ?ED Discharge Orders   ? ? None  ? ?  ? ? ? ?Note:  This document was prepared using Dragon voice recognition  software and may include unintentional dictation errors. ?  ?Jene Every, MD ?11/01/21 1335 ? ?

## 2021-11-01 NOTE — ED Notes (Signed)
US at bedside

## 2021-11-01 NOTE — ED Notes (Signed)
Called MRI at request of pt and family as to what is taking so long. MRI advised they were waiting on preg test.  ?

## 2021-11-02 LAB — HEPATITIS PANEL, ACUTE
HCV Ab: NONREACTIVE
Hep A IgM: NONREACTIVE
Hep B C IgM: NONREACTIVE
Hepatitis B Surface Ag: NONREACTIVE

## 2021-11-02 LAB — CK: Total CK: 97 U/L (ref 38–234)

## 2021-11-02 NOTE — ED Notes (Signed)
Pt declined repeat vitals

## 2021-11-02 NOTE — Discharge Instructions (Addendum)
Avoid taking any Tylenol at this time.  Please call the surgery number on Monday to try to get follow-up on Tuesday.  Please call your OB/GYN and see if they can order you some repeat LFTs for early next week.  If you develop return of pain please return to the ER immediately for repeat evaluation. ?

## 2021-11-05 ENCOUNTER — Ambulatory Visit (INDEPENDENT_AMBULATORY_CARE_PROVIDER_SITE_OTHER): Payer: 59 | Admitting: Obstetrics and Gynecology

## 2021-11-05 ENCOUNTER — Encounter: Payer: Self-pay | Admitting: Obstetrics and Gynecology

## 2021-11-05 VITALS — BP 134/82 | HR 91 | Resp 16 | Ht 66.0 in | Wt 228.6 lb

## 2021-11-05 DIAGNOSIS — K8020 Calculus of gallbladder without cholecystitis without obstruction: Secondary | ICD-10-CM | POA: Diagnosis not present

## 2021-11-05 NOTE — Progress Notes (Signed)
? ? ?GYNECOLOGY PROGRESS NOTE ? ?Subjective:  ? ? Patient ID: Sara MoritaBobbi Burch, female    DOB: 10/08/1985, 36 y.o.   MRN: [id number] ? ?HPI ? Patient is a 36 y.o. [id number] female who presents for f/u from ER visit.  Was seen on 11/01/2021 for complaints of epigastric pain.  Was diagnosed with gallstones.  Currently without symptoms today. ? ?The following portions of the patient's history were reviewed and updated as appropriate: allergies, current medications, past family history, past medical history, past social history, past surgical history, and problem list. ? ?Review of Systems ?Pertinent items noted in HPI and remainder of comprehensive ROS otherwise negative.  ? ?Objective:  ?Blood pressure 134/82, pulse 91, resp. rate 16, weight 228 lb 9.6 oz (103.7 kg), currently breastfeeding.  Body mass index is 36.9 kg/m?. ? ?General appearance: alert and no distress ?Abdomen: soft, non-tender; bowel sounds normal; no masses,  no organomegaly ?Pelvic: deferred ? ? ?Labs:  ?Admission on 11/01/2021, Discharged on 11/02/2021  ?Component Date Value Ref Range Status  ? Sodium 11/01/2021 137  135 - 145 mmol/L Final  ? Potassium 11/01/2021 4.0  3.5 - 5.1 mmol/L Final  ? Chloride 11/01/2021 103  98 - 111 mmol/L Final  ? CO2 11/01/2021 25  22 - 32 mmol/L Final  ? Glucose, Bld 11/01/2021 131 (H)  70 - 99 mg/dL Final  ? Glucose reference range applies only to samples taken after fasting for at least 8 hours.  ? BUN 11/01/2021 9  6 - 20 mg/dL Final  ? Creatinine, Ser 11/01/2021 0.59  0.44 - 1.00 mg/dL Final  ? Calcium 40/98/119104/28/2023 9.8  8.9 - 10.3 mg/dL Final  ? GFR, Estimated 11/01/2021 >60  >60 mL/min Final  ? Comment: (NOTE) ?Calculated using the CKD-EPI Creatinine Equation (2021) ?  ? Anion gap 11/01/2021 9  5 - 15 Final  ? Performed at [facility], [address] [place], [place] [address]  ? WBC 11/01/2021 5.1  4.0 - 10.5 K/uL Final  ? RBC 11/01/2021 4.60  3.87 - 5.11 MIL/uL Final  ? Hemoglobin 11/01/2021 13.1  12.0 - 15.0  g/dL Final  ? HCT 56/21/308604/28/2023 41.2  36.0 - 46.0 % Final  ? MCV 11/01/2021 89.6  80.0 - 100.0 fL Final  ? MCH 11/01/2021 28.5  26.0 - 34.0 pg Final  ? MCHC 11/01/2021 31.8  30.0 - 36.0 g/dL Final  ? RDW 57/84/696204/28/2023 14.3  11.5 - 15.5 % Final  ? Platelets 11/01/2021 150  150 - 400 K/uL Final  ? nRBC 11/01/2021 0.0  0.0 - 0.2 % Final  ? Performed at [facility], [address] [place], [place] [address]  ? Troponin I (High Sensitivity) 11/01/2021 3  <18 ng/L Final  ? Comment: (NOTE) ?Elevated high sensitivity troponin I (hsTnI) values and significant  ?changes across serial measurements may suggest ACS but many other  ?chronic and acute conditions are known to elevate hsTnI results.  ?Refer to the "Links" section for chest pain algorithms and additional  ?guidance. ?Performed at [facility], [address] [place], ?[place] [id number] ?  ? Total Protein 11/01/2021 7.4  6.5 - 8.1 g/dL Final  ? Albumin 01/02/725304/28/2023 3.9  3.5 - 5.0 g/dL Final  ? AST 66/44/034704/28/2023 607 (H)  15 - 41 U/L Final  ? ALT 11/01/2021 448 (H)  0 - 44 U/L Final  ? Alkaline Phosphatase 11/01/2021 83  38 - 126 U/L Final  ? Total Bilirubin 11/01/2021 0.9  0.3 - 1.2 mg/dL  Final  ? Bilirubin, Direct 11/01/2021 0.2  0.0 - 0.2 mg/dL Final  ? Indirect Bilirubin 11/01/2021 0.7  0.3 - 0.9 mg/dL Final  ? Performed at [facility], [address], [place], [place] [address]  ? Troponin I (High Sensitivity) 11/01/2021 4  <18 ng/L Final  ? Comment: (NOTE) ?Elevated high sensitivity troponin I (hsTnI) values and significant  ?changes across serial measurements may suggest ACS but many other  ?chronic and acute conditions are known to elevate hsTnI results.  ?Refer to the "Links" section for chest pain algorithms and additional  ?guidance. ?Performed at [facility], [address] [place], ?[place] [address] ?  ? Lipase 11/01/2021 34  11 - 51 U/L Final  ? Performed at [facility], [address] [place], [place]  [address]  ? Total Protein 11/01/2021 7.5  6.5 - 8.1 g/dL Final  ? Albumin 03/55/9741 3.9  3.5 - 5.0 g/dL Final  ? AST 63/84/5364 517 (H)  15 - 41 U/L Final  ? ALT 11/01/2021 734 (H)  0 - 44 U/L Final  ? Alkaline Phosphatase 11/01/2021 99  38 - 126 U/L Final  ? Total Bilirubin 11/01/2021 0.9  0.3 - 1.2 mg/dL Final  ? Bilirubin, Direct 11/01/2021 0.1  0.0 - 0.2 mg/dL Final  ? Indirect Bilirubin 11/01/2021 0.8  0.3 - 0.9 mg/dL Final  ? Performed at [facility], [address], [place], [place] [address]  ? Prothrombin Time 11/01/2021 12.9  11.4 - 15.2 seconds Final  ? INR 11/01/2021 1.0  0.8 - 1.2 Final  ? Comment: (NOTE) ?INR goal varies based on device and disease states. ?Performed at [facility], [address] [place], ?[place] [address] ?  ? Hepatitis B Surface Ag 11/01/2021 NON REACTIVE  NON REACTIVE Final  ? HCV Ab 11/01/2021 NON REACTIVE  NON REACTIVE Final  ? Comment: (NOTE) ?Nonreactive HCV antibody screen is consistent with no HCV infections,  ?unless recent infection is suspected or other evidence exists to ?indicate HCV infection. ? ?  ? Hep A IgM 11/01/2021 NON REACTIVE  NON REACTIVE Final  ? Hep B C IgM 11/01/2021 NON REACTIVE  NON REACTIVE Final  ? Performed at [facility], [address], [place], [place] [address]  ? Acetaminophen (Tylenol), Serum 11/01/2021 <10 (L)  10 - 30 ug/mL Final  ? Comment: (NOTE) ?Therapeutic concentrations vary significantly. A range of 10-30 ug/mL  ?may be an effective concentration for many patients. However, some  ?are best treated at concentrations outside of this range. ?Acetaminophen concentrations >150 ug/mL at 4 hours after ingestion  ?and >50 ug/mL at 12 hours after ingestion are often associated with  ?toxic reactions. ? ?Performed at [facility], [address] [place], ?[place] [address] ?  ? Color, Urine 11/01/2021 YELLOW (A)  YELLOW Final  ? APPearance 11/01/2021 HAZY (A)  CLEAR Final  ? Specific Gravity, Urine 11/01/2021  1.028  1.005 - 1.030 Final  ? pH 11/01/2021 6.0  5.0 - 8.0 Final  ? Glucose, UA 11/01/2021 NEGATIVE  NEGATIVE mg/dL Final  ? Hgb urine dipstick 11/01/2021 NEGATIVE  NEGATIVE Final  ? Bilirubin Urine 11/01/2021 NEGATIVE  NEGATIVE Final  ? Ketones, ur 11/01/2021 NEGATIVE  NEGATIVE mg/dL Final  ? Protein, ur 89/16/9450 NEGATIVE  NEGATIVE mg/dL Final  ? Nitrite 38/88/2800 NEGATIVE  NEGATIVE Final  ? Leukocytes,Ua 11/01/2021 TRACE (A)  NEGATIVE Final  ? RBC / HPF 11/01/2021 6-10  0 - 5 RBC/hpf Final  ?  WBC, UA 11/01/2021 11-20  0 - 5 WBC/hpf Final  ? Bacteria, UA 11/01/2021 NONE SEEN  NONE SEEN Final  ? Squamous Epithelial / LPF 11/01/2021 0-5  0 - 5 Final  ? Mucus 11/01/2021 PRESENT   Final  ? Performed at [facility], [address] [place], [place] [address]  ? Creatinine, Urine 11/01/2021 201  mg/dL Final  ? Total Protein, Urine 11/01/2021 16  mg/dL Final  ? NO NORMAL RANGE ESTABLISHED FOR THIS TEST  ? Protein Creatinine Ratio 11/01/2021 0.08  0.00 - 0.15 mg/mg[Cre] Final  ? Performed at [facility], [address] [place], [place] [address]  ? Total CK 11/01/2021 97  38 - 234 U/L Final  ? Performed at [facility], [address] [place], [place] [address]  ? ? ? ?Imaging:  ?MR ABDOMEN MRCP W WO CONTAST ?CLINICAL DATA:  RIGHT upper quadrant abdominal pain with ?nondiagnostic ultrasound in a 36 year old female. ? ?EXAM: ?MRI ABDOMEN WITHOUT AND WITH CONTRAST (INCLUDING MRCP) ? ?TECHNIQUE: ?Multiplanar multisequence MR imaging of the abdomen was performed ?both before and after the administration of intravenous contrast. ?Heavily T2-weighted images of the biliary and pancreatic ducts were ?obtained, and three-dimensional MRCP images were rendered by post ?processing. ? ?CONTRAST:  102mL GADAVIST GADOBUTROL 1 MMOL/ML IV SOLN ? ?COMPARISON:  Ultrasound evaluation of the same date. ? ?FINDINGS: ?Lower chest: No effusion or consolidation. Limited assessment of the ?lung bases on  MRI. ? ?Hepatobiliary: Mild to moderate hepatic steatosis. No focal, ?suspicious hepatic lesion. Small volume pericholecystic fluid. No ?wall thickening or hyperenhancement of the gallbladder wall. ?Numerous ga

## 2021-11-07 ENCOUNTER — Ambulatory Visit (INDEPENDENT_AMBULATORY_CARE_PROVIDER_SITE_OTHER): Payer: 59 | Admitting: Surgery

## 2021-11-07 ENCOUNTER — Encounter: Payer: Self-pay | Admitting: Surgery

## 2021-11-07 ENCOUNTER — Other Ambulatory Visit: Payer: Self-pay

## 2021-11-07 ENCOUNTER — Other Ambulatory Visit
Admission: RE | Admit: 2021-11-07 | Discharge: 2021-11-07 | Disposition: A | Discharge status: Home / Self Care | Payer: 59 | Attending: Surgery | Admitting: Surgery

## 2021-11-07 VITALS — BP 126/82 | HR 55 | Temp 98.1°F | Ht 66.0 in | Wt 231.2 lb

## 2021-11-07 DIAGNOSIS — K8020 Calculus of gallbladder without cholecystitis without obstruction: Secondary | ICD-10-CM | POA: Diagnosis not present

## 2021-11-07 DIAGNOSIS — R7989 Other specified abnormal findings of blood chemistry: Secondary | ICD-10-CM | POA: Diagnosis not present

## 2021-11-07 LAB — HEPATIC FUNCTION PANEL
ALT: 99 U/L — ABNORMAL HIGH (ref 0–44)
AST: 22 U/L (ref 15–41)
Albumin: 3.9 g/dL (ref 3.5–5.0)
Alkaline Phosphatase: 74 U/L (ref 38–126)
Bilirubin, Direct: <0.1 mg/dL (ref 0.0–0.2)
Total Bilirubin: 0.5 mg/dL (ref 0.3–1.2)
Total Protein: 7.7 g/dL (ref 6.5–8.1)

## 2021-11-07 NOTE — Patient Instructions (Addendum)
If you decide to move forward with surgery please call the office.  ? ?Please go to the [facility] lab today to have your blood drawn.  ? ?Gallbladder Eating Plan ?High blood cholesterol, obesity, a sedentary lifestyle, an unhealthy diet, and diabetes are risk factors for developing gallstones. ?If you have a gallbladder condition, you may have trouble digesting fats and tolerating high fat intake. Eating a low-fat diet can help reduce your symptoms and may be helpful before and after having surgery to remove your gallbladder (cholecystectomy). Your health care provider may recommend that you work with a dietitian to help you reduce the amount of fat in your diet. ?What are tips for following this plan? ?General guidelines ?Limit your fat intake to less than 30% of your total daily calories. If you eat around 1,800 calories each day, this means eating less than 60 grams (g) of fat per day. ?Fat is an important part of a healthy diet. Eating a low-fat diet can make it hard to maintain a healthy body weight. Ask your dietitian how much fat, calories, and other nutrients you need each day. ?Eat small, frequent meals throughout the day instead of three large meals. ?Drink at least 8-10 cups (1.9-2.4 L) of fluid a day. Drink enough fluid to keep your urine pale yellow. ?If you drink alcohol: ?Limit how much you have to: ?0-1 drink a day for women who are not pregnant. ?0-2 drinks a day for men. ?Know how much alcohol is in a drink. In the [place] one drink equals one 12 oz bottle of beer (355 mL), one 5 oz glass of wine (148 mL), or one 1? oz glass of hard liquor (44 mL). ?Reading food labels ? ?Check nutrition facts on food labels for the amount of fat per serving. Choose foods with less than 3 grams of fat per serving. ?Shopping ?Choose nonfat and low-fat healthy foods. Look for the words "nonfat," "low-fat," or "fat-free." ?Avoid buying processed or prepackaged foods. ?Cooking ?Cook using low-fat methods, such as baking,  broiling, grilling, or boiling. ?Cook with small amounts of healthy fats, such as olive oil, grapeseed oil, canola oil, avocado oil, or sunflower oil. ?What foods are recommended? ? ?All fresh, frozen, or canned fruits and vegetables. ?Whole grains. ?Low-fat or nonfat (skim) milk and yogurt. ?Lean meat, skinless poultry, fish, eggs, and beans. ?Low-fat protein supplement powders or drinks. ?Spices and herbs. ?The items listed above may not be a complete list of foods and beverages you can eat and drink. Contact a dietitian for more information. ?What foods are not recommended? ?High-fat foods. These include baked goods, fast food, fatty cuts of meat, ice cream, french toast, sweet rolls, pizza, cheese bread, foods covered with butter, creamy sauces, or cheese. ?Fried foods. These include french fries, tempura, battered fish, breaded chicken, fried breads, and sweets. ?Foods that cause bloating and gas. ?The items listed above may not be a complete list of foods that you should avoid. Contact a dietitian for more information. ?Summary ?A low-fat diet can be helpful if you have a gallbladder condition, or before and after gallbladder surgery. ?Limit your fat intake to less than 30% of your total daily calories. This is about 60 g of fat if you eat 1,800 calories each day. ?Eat small, frequent meals throughout the day instead of three large meals. ?This information is not intended to replace advice given to you by your health care provider. Make sure you discuss any questions you have with your health care provider. ?Document  Revised: 06/07/2021 Document Reviewed: 06/07/2021 ?Elsevier Patient Education ? 2023 [employer] ? ? ?Minimally Invasive Cholecystectomy ?Minimally invasive cholecystectomy is surgery to remove the gallbladder. The gallbladder is a pear-shaped organ that lies beneath the liver on the right side of the body. The gallbladder stores bile, which is a fluid that helps the body digest fats.  Cholecystectomy is often done to treat inflammation (irritation and swelling) of the gallbladder (cholecystitis). This condition is usually caused by a buildup of gallstones (cholelithiasis) in the gallbladder or when the fluid in the gall bladder becomes stagnant because gallstones get stuck in the ducts (tubes) and block the flow of bile. This can result in inflammation and pain. In severe cases, emergency surgery may be required. ?This procedure is done through small incisions in the abdomen, instead of one large incision. It is also called laparoscopic surgery. A thin scope with a camera (laparoscope) is inserted through one incision. Then surgical instruments are inserted through the other incisions. In some cases, a minimally invasive surgery may need to be changed to a surgery that is done through a larger incision. This is called open surgery. ?Tell a health care provider about: ?Any allergies you have. ?All medicines you are taking, including vitamins, herbs, eye drops, creams, and over-the-counter medicines. ?Any problems you or family members have had with anesthetic medicines. ?Any bleeding problems you have. ?Any surgeries you have had. ?Any medical conditions you have. ?Whether you are pregnant or may be pregnant. ?What are the risks? ?Generally, this is a safe procedure. However, problems may occur, including: ?Infection. ?Bleeding. ?Allergic reactions to medicines. ?Damage to nearby structures or organs. ?A gallstone remaining in the common bile duct. The common bile duct carries bile from the gallbladder to the small intestine. ?A bile leak from the liver or cystic duct after your gallbladder is removed. ?What happens before the procedure? ?When to stop eating and drinking ?Follow instructions from your health care provider about what you may eat and drink before your procedure. These may include: ?8 hours before the procedure ?Stop eating most foods. Do not eat meat, fried foods, or fatty  foods. ?Eat only light foods, such as toast or crackers. ?All liquids are okay except energy drinks and alcohol. ?6 hours before the procedure ?Stop eating. ?Drink only clear liquids, such as water, clear fruit juice, black coffee, plain tea, and sports drinks. ?Do not drink energy drinks or alcohol. ?2 hours before the procedure ?Stop drinking all liquids. ?You may be allowed to take medicines with small sips of water. ?If you do not follow your health care provider's instructions, your procedure may be delayed or canceled. ?Medicines ?Ask your health care provider about: ?Changing or stopping your regular medicines. This is especially important if you are taking diabetes medicines or blood thinners. ?Taking medicines such as aspirin and ibuprofen. These medicines can thin your blood. Do not take these medicines unless your health care provider tells you to take them. ?Taking over-the-counter medicines, vitamins, herbs, and supplements. ?General instructions ?If you will be going home right after the procedure, plan to have a responsible adult: ?Take you home from the hospital or clinic. You will not be allowed to drive. ?Care for you for the time you are told. ?Do not use any products that contain nicotine or tobacco for at least 4 weeks before the procedure. These products include cigarettes, chewing tobacco, and vaping devices, such as e-cigarettes. If you need help quitting, ask your health care provider. ?Ask your health care provider: ?  How your surgery site will be marked. ?What steps will be taken to help prevent infection. These may include: ?Removing hair at the surgery site. ?Washing skin with a germ-killing soap. ?Taking antibiotic medicine. ?What happens during the procedure? ? ?An IV will be inserted into one of your veins. ?You will be given one or both of the following: ?A medicine to help you relax (sedative). ?A medicine to make you fall asleep (general anesthetic). ?Your surgeon will make several  small incisions in your abdomen. ?The laparoscope will be inserted through one of the small incisions. The camera on the laparoscope will send images to a monitor in the operating room. This lets your surgeon see inside you

## 2021-11-07 NOTE — Progress Notes (Signed)
Patient ID: Sara Burch, female   DOB: 1985-11-04, 36 y.o.   MRN: [id number] ? ?Chief Complaint: Follow-up from ER. ? ?History of Present Illness ?Sara Burch is a 36 y.o. female with presentation to the ER last weekend following a [employer] meal.  She is postpartum just over a month or more, currently eating out sufficiently.  Who presented with upper abdominal gas pain.  ER work-up was extensive considering the degree of abnormal transaminases.  Ultrasound confirmed small stones and sludge but no other diagnostic worry, MRCP was done due to the abnormal level of transaminase, and it was also reassuring for lack of complicating factor.  Her pain is resolved, and has been well controlled with her diet since that time.  She is currently eating salads and soups, avoiding salad dressing and oils.  So she has been extremely cautious.  She had a C-section in March, and her maternity leave will come to closure in June.  She is currently uninterested in proceeding with an operation at this time. ? ?Past Medical History ?Past Medical History:  ?Diagnosis Date  ? Miscarriage   ?  ? ? ?Past Surgical History:  ?Procedure Laterality Date  ? CESAREAN SECTION  09/25/2021  ? Procedure: CESAREAN SECTION;  Surgeon: Hildred Laser, MD;  Location: [facility] ORS;  Service: Obstetrics;;  ? ? ?No Known Allergies ? ?Current Outpatient Medications  ?Medication Sig Dispense Refill  ? b complex vitamins capsule Take 1 capsule by mouth daily. Every other day    ? Cetirizine HCl (ZYRTEC PO) Take by mouth.    ? Cod Liver Oil 10 MINIM CAPS Take by mouth.    ? Prenatal Vit-Fe Fumarate-FA (MULTIVITAMIN-PRENATAL) 27-0.8 MG TABS tablet Take 1 tablet by mouth daily at 12 noon.    ? ?No current facility-administered medications for this visit.  ? ? ?Family History ?Family History  ?Problem Relation Age of Onset  ? Healthy Mother   ? Diabetes Father   ? Breast cancer Neg Hx   ? Ovarian cancer Neg Hx   ? Colon cancer Neg Hx   ?  ? ? ?Social History ?Social  History  ? ?Tobacco Use  ? Smoking status: Never  ?  Passive exposure: Never  ? Smokeless tobacco: Never  ?Vaping Use  ? Vaping Use: Never used  ?Substance Use Topics  ? Alcohol use: Not Currently  ?  Comment: rare  ? Drug use: No  ?  ?  ? ? ?Review of Systems  ?Constitutional: Negative.   ?HENT:  Positive for sinus pain.   ?Eyes: Negative.   ?Respiratory: Negative.    ?Cardiovascular: Negative.   ?Gastrointestinal:  Positive for abdominal pain.  ?Genitourinary: Negative.   ?Skin: Negative.   ?Neurological:  Positive for headaches.  ?Psychiatric/Behavioral: Negative.    ?  ? ?Physical Exam ?Blood pressure 126/82, pulse (!) 55, temperature 98.1 ?F (36.7 ?C), temperature source Oral, height 5\' 6"  (1.676 m), weight 231 lb 3.2 oz (104.9 kg), SpO2 98 %, currently breastfeeding. ?Last Weight  Most recent update: 11/07/2021 10:47 AM  ? ? Weight  ?104.9 kg (231 lb 3.2 oz)  ?      ? ?  ? ? ?CONSTITUTIONAL: Well developed, and nourished, appropriately responsive and aware without distress.   ?EYES: Sclera non-icteric.   ?EARS, NOSE, MOUTH AND THROAT: Mask worn.   Hearing is intact to voice.  ?NECK: Trachea is midline, and there is no jugular venous distension.  ?LYMPH NODES:  Lymph nodes in the neck are not enlarged. ?RESPIRATORY:  Lungs are clear, and breath sounds are equal bilaterally. Normal respiratory effort without pathologic use of accessory muscles. ?CARDIOVASCULAR: Heart is regular in rate and rhythm. ?GI: The abdomen is soft, nontender, and nondistended. There were no palpable masses. I did not appreciate hepatosplenomegaly. There were normal bowel sounds. ?MUSCULOSKELETAL:  Symmetrical muscle tone appreciated in all four extremities.    ?SKIN: Skin turgor is normal. No pathologic skin lesions appreciated.  ?NEUROLOGIC:  Motor and sensation appear grossly normal.  Cranial nerves are grossly without defect. ?PSYCH:  Alert and oriented to person, place and time. Affect is appropriate for situation. ? ?Data Reviewed ?I  have personally reviewed what is currently available of the patient's imaging, recent labs and medical records.   ?Labs:  ? ?  Latest Ref Rng & Units 11/01/2021  ?  8:55 AM 09/26/2021  ?  3:13 AM 09/25/2021  ?  3:25 PM  ?CBC  ?WBC 4.0 - 10.5 K/uL 5.1   9.8   7.2    ?Hemoglobin 12.0 - 15.0 g/dL 16.113.1   09.610.5   04.512.6    ?Hematocrit 36.0 - 46.0 % 41.2   32.5   38.7    ?Platelets 150 - 400 K/uL 150   149   181    ? ? ?  Latest Ref Rng & Units 11/01/2021  ?  8:52 PM 11/01/2021  ?  8:55 AM 11/07/2020  ?  9:11 AM  ?CMP  ?Glucose 70 - 99 mg/dL  409131   98    ?BUN 6 - 20 mg/dL  9   8    ?Creatinine 0.44 - 1.00 mg/dL  8.110.59   9.140.61    ?Sodium 135 - 145 mmol/L  137   136    ?Potassium 3.5 - 5.1 mmol/L  4.0   4.3    ?Chloride 98 - 111 mmol/L  103   100    ?CO2 22 - 32 mmol/L  25     ?Calcium 8.9 - 10.3 mg/dL  9.8   9.1    ?Total Protein 6.5 - 8.1 g/dL 7.5   7.4   7.2    ?Total Bilirubin 0.3 - 1.2 mg/dL 0.9   0.9   0.3    ?Alkaline Phos 38 - 126 U/L 99   83   67    ?AST 15 - 41 U/L 517   607   15    ?ALT 0 - 44 U/L 734   448   16    ? ? ? ?Imaging: ?Radiology review:  ?CLINICAL DATA:  A 36 year old female presents with history of ?abdominal pain. ?  ?EXAM: ?ULTRASOUND ABDOMEN LIMITED RIGHT UPPER QUADRANT ?  ?COMPARISON:  None ?  ?FINDINGS: ?Gallbladder: ?  ?Multiple gallstones and sludge layer dependently in the gallbladder. ?No gallbladder wall thickening or pericholecystic fluid. No reported ?tenderness over the gallbladder. Largest calculus 7-8 mm. ?  ?Common bile duct: ?  ?Diameter: 6.5 mm. No intrahepatic biliary duct distension is ?identified. ?  ?Liver: ?  ?No focal lesion identified. Within normal limits in parenchymal ?echogenicity. Portal vein is patent on color Doppler imaging with ?normal direction of blood flow towards the liver. ?  ?Other: None. ?  ?IMPRESSION: ?Mild dilation of the common bile duct in this patient with extensive ?cholelithiasis. Findings could reflect occult choledocholithiasis, ?MRCP may be helpful for further  evaluation as warranted. ?  ?No definitive signs of cholecystitis at this time though there are ?numerous gallstones. ?  ?  ?Electronically Signed ?  By:  Donzetta Kohut M.D. ?  On: 11/01/2021 10:18 ? ?CLINICAL DATA:  RIGHT upper quadrant abdominal pain with ?nondiagnostic ultrasound in a 36 year old female. ?  ?EXAM: ?MRI ABDOMEN WITHOUT AND WITH CONTRAST (INCLUDING MRCP) ?  ?TECHNIQUE: ?Multiplanar multisequence MR imaging of the abdomen was performed ?both before and after the administration of intravenous contrast. ?Heavily T2-weighted images of the biliary and pancreatic ducts were ?obtained, and three-dimensional MRCP images were rendered by post ?processing. ?  ?CONTRAST:  34mL GADAVIST GADOBUTROL 1 MMOL/ML IV SOLN ?  ?COMPARISON:  Ultrasound evaluation of the same date. ?  ?FINDINGS: ?Lower chest: No effusion or consolidation. Limited assessment of the ?lung bases on MRI. ?  ?Hepatobiliary: Mild to moderate hepatic steatosis. No focal, ?suspicious hepatic lesion. Small volume pericholecystic fluid. No ?wall thickening or hyperenhancement of the gallbladder wall. ?Numerous gallstones layer dependently in the gallbladder. ?  ?No filling defect in the biliary tree beyond the gallbladder. No ?current evidence of biliary duct dilation. ?  ?Pancreas: Normal intrinsic T1 signal. No ductal dilation or sign of ?inflammation. No focal lesion. ?  ?Spleen:  Normal. ?  ?Adrenals/Urinary Tract: Normal adrenal glands. No focal, suspicious ?renal lesion. No hydronephrosis. No perinephric stranding. ?  ?Stomach/Bowel: No acute gastrointestinal process to the extent ?evaluated on this abdominal MRI. ?  ?Vascular/Lymphatic: No pathologically enlarged lymph nodes ?identified. No abdominal aortic aneurysm demonstrated. ?  ?Other:  No ascites ?  ?Musculoskeletal: No suspicious bone lesions identified. ?  ?IMPRESSION: ?1. Numerous gallstones layer dependently in the gallbladder. Small ?volume pericholecystic fluid. No wall  thickening or hyperenhancement ?of the gallbladder wall. Findings more likely related to underlying ?hepatic dysfunction than gallbladder pathology given the absence of ?wall thickening or enhancement current

## 2021-12-05 ENCOUNTER — Encounter: Payer: Self-pay | Admitting: Obstetrics and Gynecology

## 2021-12-06 ENCOUNTER — Ambulatory Visit: Payer: Self-pay

## 2021-12-06 DIAGNOSIS — R8290 Unspecified abnormal findings in urine: Secondary | ICD-10-CM

## 2021-12-06 DIAGNOSIS — Z0000 Encounter for general adult medical examination without abnormal findings: Secondary | ICD-10-CM

## 2021-12-06 LAB — POCT URINALYSIS DIPSTICK
Bilirubin, UA: NEGATIVE
Glucose, UA: NEGATIVE
Ketones, UA: NEGATIVE
Nitrite, UA: NEGATIVE
Protein, UA: POSITIVE — AB
Spec Grav, UA: 1.025
Urobilinogen, UA: 0.2 U/dL
pH, UA: 6

## 2021-12-06 NOTE — Progress Notes (Signed)
Pt presents today for physical labs, will return to clinic for scheduled physical.  

## 2021-12-06 NOTE — Addendum Note (Signed)
Addended by: Christianne Dolin F on: 12/06/2021 04:03 PM   Modules accepted: Orders

## 2021-12-07 LAB — CMP12+LP+TP+TSH+6AC+CBC/D/PLT
ALT: 32 IU/L (ref 0–32)
AST: 19 IU/L (ref 0–40)
Albumin/Globulin Ratio: 1.8 (ref 1.2–2.2)
Albumin: 4.5 g/dL (ref 3.8–4.8)
Alkaline Phosphatase: 109 IU/L (ref 44–121)
BUN/Creatinine Ratio: 14 (ref 9–23)
BUN: 9 mg/dL (ref 6–20)
Basophils Absolute: 0 10*3/uL (ref 0.0–0.2)
Basos: 0 %
Bilirubin Total: 0.6 mg/dL (ref 0.0–1.2)
Calcium: 9.4 mg/dL (ref 8.7–10.2)
Chloride: 101 mmol/L (ref 96–106)
Chol/HDL Ratio: 3.5 ratio (ref 0.0–4.4)
Cholesterol, Total: 173 mg/dL (ref 100–199)
Creatinine, Ser: 0.65 mg/dL (ref 0.57–1.00)
EOS (ABSOLUTE): 0.1 10*3/uL (ref 0.0–0.4)
Eos: 2 %
Estimated CHD Risk: 0.6 times avg. (ref 0.0–1.0)
Free Thyroxine Index: 2.2 (ref 1.2–4.9)
GGT: 168 IU/L — ABNORMAL HIGH (ref 0–60)
Globulin, Total: 2.5 g/dL (ref 1.5–4.5)
Glucose: 88 mg/dL (ref 70–99)
HDL: 49 mg/dL (ref >39)
Hematocrit: 39.8 % (ref 34.0–46.6)
Hemoglobin: 13.9 g/dL (ref 11.1–15.9)
Immature Grans (Abs): 0 10*3/uL (ref 0.0–0.1)
Immature Granulocytes: 0 %
Iron: 75 ug/dL (ref 27–159)
LDH: 158 IU/L (ref 119–226)
LDL Chol Calc (NIH): 113 mg/dL — ABNORMAL HIGH (ref 0–99)
Lymphocytes Absolute: 1.8 10*3/uL (ref 0.7–3.1)
Lymphs: 52 %
MCH: 29.9 pg (ref 26.6–33.0)
MCHC: 34.9 g/dL (ref 31.5–35.7)
MCV: 86 fL (ref 79–97)
Monocytes Absolute: 0.4 10*3/uL (ref 0.1–0.9)
Monocytes: 10 %
Neutrophils Absolute: 1.3 10*3/uL — ABNORMAL LOW (ref 1.4–7.0)
Neutrophils: 36 %
Phosphorus: 3.1 mg/dL (ref 3.0–4.3)
Platelets: 167 10*3/uL (ref 150–450)
Potassium: 4.3 mmol/L (ref 3.5–5.2)
RBC: 4.65 x10E6/uL (ref 3.77–5.28)
RDW: 14.4 % (ref 11.7–15.4)
Sodium: 139 mmol/L (ref 134–144)
T3 Uptake Ratio: 35 % (ref 24–39)
T4, Total: 6.2 ug/dL (ref 4.5–12.0)
TSH: 1.2 u[IU]/mL (ref 0.450–4.500)
Total Protein: 7 g/dL (ref 6.0–8.5)
Triglycerides: 56 mg/dL (ref 0–149)
Uric Acid: 4.3 mg/dL (ref 2.6–6.2)
VLDL Cholesterol Cal: 11 mg/dL (ref 5–40)
WBC: 3.5 10*3/uL (ref 3.4–10.8)
eGFR: 117 mL/min/{1.73_m2} (ref >59)

## 2021-12-09 ENCOUNTER — Other Ambulatory Visit: Payer: Self-pay | Admitting: Physician Assistant

## 2021-12-09 LAB — URINE CULTURE

## 2021-12-09 MED ORDER — AMOXICILLIN 875 MG PO TABS: 875.0000 mg | ORAL_TABLET | Freq: Two times a day (BID) | ORAL | 0 refills | Status: AC

## 2021-12-10 ENCOUNTER — Other Ambulatory Visit: Payer: Self-pay | Admitting: Physician Assistant

## 2021-12-10 MED ORDER — FLUCONAZOLE 150 MG PO TABS: 150.0000 mg | ORAL_TABLET | Freq: Once | ORAL | 0 refills | Status: AC

## 2021-12-12 ENCOUNTER — Encounter: Payer: Self-pay | Admitting: Physician Assistant

## 2021-12-12 ENCOUNTER — Ambulatory Visit: Payer: Self-pay | Admitting: Physician Assistant

## 2021-12-12 VITALS — BP 131/81 | HR 82 | Temp 97.8°F | Resp 14 | Ht 66.0 in | Wt 219.0 lb

## 2021-12-12 DIAGNOSIS — Z0000 Encounter for general adult medical examination without abnormal findings: Secondary | ICD-10-CM

## 2021-12-12 NOTE — Progress Notes (Signed)
Pt presents today to complete physical, Pt denies any issues or concerns at this time/CL,RMA 

## 2021-12-12 NOTE — Progress Notes (Signed)
[facility] Emergency Department Provider Note  ____________________________________________   None    (approximate)  I have reviewed the triage vital signs and the nursing notes.   HISTORY  Chief Complaint Annual Exam  HPI Sara Burch is a 36 y.o. female patient presented annual physical exam.  Patient is 10 weeks postpartum.  Patient is not breast-feeding.  Patient also states she was recently diagnosed with gallstones but surgery is being deferred due to her recent C-section.  Patient states she is controlling her symptoms with diet.  Patient voices no other concerns or complaints.         Past Medical History:  Diagnosis Date   Miscarriage     Patient Active Problem List   Diagnosis Date Noted   Abnormal LFTs 11/07/2021   Calculus of gallbladder without cholecystitis without obstruction 11/05/2021   H/O fibromyalgia 12/14/2019   Carpal tunnel syndrome, bilateral 05/26/2012   Family history of thyroid disorder 01/03/2012    Past Surgical History:  Procedure Laterality Date   CESAREAN SECTION  09/25/2021   Procedure: CESAREAN SECTION;  Surgeon: Rubie Maid, MD;  Location: [facility] ORS;  Service: Obstetrics;;    Prior to Admission medications   Medication Sig Start Date End Date Taking? Authorizing Provider  b complex vitamins capsule Take 1 capsule by mouth daily. Every other day   Yes [provider]  Cetirizine HCl (ZYRTEC PO) Take by mouth.   Yes [provider]  Cod Liver Oil 10 MINIM CAPS Take by mouth.   Yes [provider]  Prenatal Vit-Fe Fumarate-FA (MULTIVITAMIN-PRENATAL) 27-0.8 MG TABS tablet Take 1 tablet by mouth daily at 12 noon.   Yes [provider]  amoxicillin (AMOXIL) 875 MG tablet Take 1 tablet (875 mg total) by mouth 2 (two) times daily for 10 days. Patient not taking: Reported on 12/12/2021 12/09/21 12/19/21  Sable Feil, PA-C  fluconazole (DIFLUCAN) 150 MG tablet Take 150 mg by mouth  once. Patient not taking: Reported on 12/12/2021 12/10/21   [provider]    Allergies Patient has no known allergies.  Family History  Problem Relation Age of Onset   Healthy Mother    Diabetes Father    Breast cancer Neg Hx    Ovarian cancer Neg Hx    Colon cancer Neg Hx     Social History Social History   Tobacco Use   Smoking status: Never    Passive exposure: Never   Smokeless tobacco: Never  Vaping Use   Vaping Use: Never used  Substance Use Topics   Alcohol use: Not Currently    Comment: rare   Drug use: No    Review of Systems Constitutional: No fever/chills Eyes: No visual changes. ENT: No sore throat. Cardiovascular: Denies chest pain. Respiratory: Denies shortness of breath. Gastrointestinal: No abdominal pain.  No nausea, no vomiting.  No diarrhea.  No constipation.  Gallstones Genitourinary: Negative for dysuria. Musculoskeletal: Negative for back pain. Skin: Negative for rash. Neurological: Negative for headaches, focal weakness or numbness.  ____________________________________________   PHYSICAL EXAM:  VITAL SIGNS: Constitutional: Alert and oriented. Well appearing and in no acute distress. Eyes: Conjunctivae are normal. PERRL. EOMI. Head: Atraumatic. Nose: No congestion/rhinnorhea. Mouth/Throat: Mucous membranes are moist.  Oropharynx non-erythematous. Neck: No stridor.  No cervical spine tenderness to palpation. Hematological/Lymphatic/Immunilogical: No cervical lymphadenopathy. Cardiovascular: Normal rate, regular rhythm. Grossly normal heart sounds.  Good peripheral circulation. Respiratory: Normal respiratory effort.  No retractions. Lungs CTAB. Gastrointestinal: Deferred  Genitourinary: Deferred Musculoskeletal:  No lower extremity tenderness nor edema.  No joint effusions. Neurologic:  Normal speech and language. No gross focal neurologic deficits are appreciated. No gait instability. Skin:  Skin is warm, dry and intact. No  rash noted. Psychiatric: Mood and affect are normal. Speech and behavior are normal.  ____________________________________________   LABS           Component Ref Range & Units 6 d ago (12/06/21) 1 mo ago (11/01/21) 2 mo ago (09/20/21) 3 mo ago (09/10/21) 3 mo ago (09/03/21) 3 mo ago (08/15/21) 4 mo ago (08/02/21)  Color, UA  yellow   bright yellow  yellow    yellow  yellow   Clarity, UA  clear   clear  cloudy    clear  clear   Glucose, UA Negative Negative         Bilirubin, UA  negative   neg  neg  Negative  neg  neg   Ketones, UA  negative   neg  neg  Negative  neg  neg   Spec Grav, UA 1.010 - 1.025 1.025   1.010  1.015  1.015  1.015  1.015   Blood, UA  1+   neg  mod  Trace  neg  neg   pH, UA 5.0 - 8.0 6.0   7.0  6.5  6.5  6.0  6.0   Protein, UA Negative Positive Abnormal          Comment: trace-+  Urobilinogen, UA 0.2 or 1.0 E.U./dL 0.2   0.2  0.2  0.2  0.2  negative Abnormal    Nitrite, UA  negative   neg  neg  Negative  neg  neg   Leukocytes, UA Negative Moderate (2+) Abnormal    Small (1+) Abnormal   Moderate (2+) Abnormal   Moderate (2+) Abnormal   Negative  Negative   Appearance   HAZY Abnormal  R        neg     Odor               Resulting Agency   [facility]              Specimen Collected: 12/06/21 10:00 Last Resulted: 12/06/21 10:00      Lab Flowsheet    Order Details    View Encounter    Lab and Collection Details    Routing    Result History    View All Conversations on this Encounter      R=Reference range differs from displayed range      Result Care Coordination   Patient Communication   Add Comments   Seen Back to Top       Other Results from 12/06/2021   Contains abnormal data CMP12+LP+TP+TSH+6AC+CBC/D/Plt Order: [id number] Status: Final result    Visible to patient: Yes (seen)    Next appt: 05/02/2022 at 09:00 AM in Obstetrics and Gynecology Rubie Maid, MD)    Dx: Routine adult health maintenance    0 Result Notes           Component Ref  Range & Units 6 d ago (12/06/21) 1 mo ago (11/07/21) 1 mo ago (11/01/21) 1 mo ago (11/01/21) 1 mo ago (11/01/21) 1 mo ago (11/01/21) 2 mo ago (09/26/21)  Glucose 70 - 99 mg/dL 88     131 High  CM     Uric Acid 2.6 - 6.2 mg/dL 4.3         Comment:  Therapeutic target for gout patients: <6.0  BUN 6 - 20 mg/dL 9     9     Creatinine, Ser 0.57 - 1.00 mg/dL 0.65     0.59 R     eGFR >59 mL/min/1.73 117         BUN/Creatinine Ratio 9 - 23 14         Sodium 134 - 144 mmol/L 139     137 R     Potassium 3.5 - 5.2 mmol/L 4.3     4.0 R     Chloride 96 - 106 mmol/L 101     103 R     Calcium 8.7 - 10.2 mg/dL 9.4     9.8 R     Phosphorus 3.0 - 4.3 mg/dL 3.1         Total Protein 6.0 - 8.5 g/dL 7.0  7.7 R  7.5 R  7.4 R      Albumin 3.8 - 4.8 g/dL 4.5  3.9 R  3.9 R  3.9 R      Globulin, Total 1.5 - 4.5 g/dL 2.5         Albumin/Globulin Ratio 1.2 - 2.2 1.8         Bilirubin Total 0.0 - 1.2 mg/dL 0.6  0.5 R  0.9 R  0.9 R      Alkaline Phosphatase 44 - 121 IU/L 109  74 R  99 R  83 R      LDH 119 - 226 IU/L 158         AST 0 - 40 IU/L 19  22 R  517 High  R  607 High  R      ALT 0 - 32 IU/L 32  99 High  R  734 High  R  448 High  R      GGT 0 - 60 IU/L 168 High          Iron 27 - 159 ug/dL 75         Cholesterol, Total 100 - 199 mg/dL 173         Triglycerides 0 - 149 mg/dL 56         HDL >39 mg/dL 49         VLDL Cholesterol Cal 5 - 40 mg/dL 11         LDL Chol Calc (NIH) 0 - 99 mg/dL 113 High          Chol/HDL Ratio 0.0 - 4.4 ratio 3.5         Comment:                                   T. Chol/HDL Ratio                                              Men  Women                                1/2 Avg.Risk  3.4    3.3  Avg.Risk  5.0    4.4                                 2X Avg.Risk  9.6    7.1                                 3X Avg.Risk 23.4   11.0   Estimated CHD Risk 0.0 - 1.0 times avg. 0.6         Comment: The CHD Risk is based on the T. Chol/HDL ratio. Other   factors affect CHD Risk such as hypertension, smoking,  diabetes, severe obesity, and family history of  premature CHD.   TSH 0.450 - 4.500 uIU/mL 1.200         T4, Total 4.5 - 12.0 ug/dL 6.2         T3 Uptake Ratio 24 - 39 % 35         Free Thyroxine Index 1.2 - 4.9 2.2         WBC 3.4 - 10.8 x10E3/uL 3.5      5.1 R  9.8 R   RBC 3.77 - 5.28 x10E6/uL 4.65      4.60 R  3.62 Low  R   Hemoglobin 11.1 - 15.9 g/dL 13.9      13.1 R  10.5 Low  R   Hematocrit 34.0 - 46.6 % 39.8      41.2 R  32.5 Low  R   MCV 79 - 97 fL 86      89.6 R  89.8 R   MCH 26.6 - 33.0 pg 29.9      28.5 R  29.0 R   MCHC 31.5 - 35.7 g/dL 34.9      31.8 R  32.3 R   RDW 11.7 - 15.4 % 14.4      14.3 R  15.6 High  R   Platelets 150 - 450 x10E3/uL 167      150 R  149 Low  R   Neutrophils Not Estab. % 36         Lymphs Not Estab. % 52         Monocytes Not Estab. % 10         Eos Not Estab. % 2         Basos Not Estab. % 0         Neutrophils Absolute 1.4 - 7.0 x10E3/uL 1.3 Low          Lymphocytes Absolute 0.7 - 3.1 x10E3/uL 1.8         Monocytes Absolute 0.1 - 0.9 x10E3/uL 0.4         EOS (ABSOLUTE) 0.0 - 0.4 x10E3/uL 0.1         Basophils Absolute 0.0 - 0.2 x10E3/uL 0.0         Immature Granulocytes Not Estab. % 0         Immature Grans              ____________________________________________  ____________________________________   ____________________________________________   INITIAL IMPRESSION / ASSESSMENT AND PLAN As part of my medical decision making, I reviewed the following data within the electronic MEDICAL RECORD NUMBER      Discussed lab results with patient.  Patient will follow-up with treating doctor for gallstones.  ____________________________________________   FINAL CLINICAL IMPRESSION  Well exam   ED Discharge Orders     None        Note:  This document was prepared using Dragon voice recognition software and may include unintentional dictation errors.

## 2022-04-30 ENCOUNTER — Encounter: Payer: Self-pay | Admitting: Obstetrics and Gynecology

## 2022-04-30 ENCOUNTER — Ambulatory Visit (INDEPENDENT_AMBULATORY_CARE_PROVIDER_SITE_OTHER): Payer: 59 | Admitting: Obstetrics and Gynecology

## 2022-04-30 VITALS — BP 149/82 | HR 75 | Resp 16 | Ht 66.0 in | Wt 219.5 lb

## 2022-04-30 DIAGNOSIS — Z6835 Body mass index (BMI) 35.0-35.9, adult: Secondary | ICD-10-CM

## 2022-04-30 DIAGNOSIS — F53 Postpartum depression: Secondary | ICD-10-CM | POA: Diagnosis not present

## 2022-04-30 DIAGNOSIS — Z01411 Encounter for gynecological examination (general) (routine) with abnormal findings: Secondary | ICD-10-CM | POA: Diagnosis not present

## 2022-04-30 DIAGNOSIS — R69 Illness, unspecified: Secondary | ICD-10-CM | POA: Diagnosis not present

## 2022-04-30 DIAGNOSIS — E669 Obesity, unspecified: Secondary | ICD-10-CM

## 2022-04-30 DIAGNOSIS — R030 Elevated blood-pressure reading, without diagnosis of hypertension: Secondary | ICD-10-CM

## 2022-04-30 DIAGNOSIS — Z01419 Encounter for gynecological examination (general) (routine) without abnormal findings: Secondary | ICD-10-CM

## 2022-04-30 NOTE — Progress Notes (Signed)
GYNECOLOGY ANNUAL PHYSICAL EXAM PROGRESS NOTE  Subjective:    Sara Burch is a 36 y.o. G64P1031 female who presents for an annual exam.  The patient is sexually active. The patient participates in regular exercise: no. Has the patient ever been transfused or tattooed?: yes. The patient reports that there is not domestic violence in her life.   The patient has the following complaints today.  She has been very emotional and hormonal lately.  Thinks she is experiencing some mild delayed postpartum depression symptoms (birth ~ 6 months ago).  Notes that she has been seeing a therapist, but still feels like more is needed.  Desires to discuss options. Denies SI/HI.   Menstrual History: Menarche age: 80 No LMP recorded. Patient is exclusively breastfeeding (lactational amenorrhea).    Gynecologic History:  Contraception: tubal ligation History of STI's: Denies Last Pap: 04/09/2021. Results were: normal.  Denies h/o abnormal pap smears.    OB History  Gravida Para Term Preterm AB Living  5 1 1  0 3 1  SAB IAB Ectopic Multiple Live Births  2 0 0 0 1    # Outcome Date GA Lbr Len/2nd Weight Sex Delivery Anes PTL Lv  5 Gravida           4 SAB 2022          3 SAB 2022          2 Term 2014   9 lb 1.8 oz (4.132 kg)  Vag-Spont   LIV  1 AB 2004            Past Medical History:  Diagnosis Date   Miscarriage     Past Surgical History:  Procedure Laterality Date   CESAREAN SECTION  09/25/2021   Procedure: CESAREAN SECTION;  Surgeon: Rubie Maid, MD;  Location: [facility] ORS;  Service: Obstetrics;;    Family History  Problem Relation Age of Onset   Healthy Mother    Diabetes Father    Breast cancer Neg Hx    Ovarian cancer Neg Hx    Colon cancer Neg Hx     Social History   Socioeconomic History   Marital status: Married    Spouse name: Not on file   Number of children: Not on file   Years of education: Not on file   Highest education level: Not on file  Occupational  History   Not on file  Tobacco Use   Smoking status: Never    Passive exposure: Never   Smokeless tobacco: Never  Vaping Use   Vaping Use: Never used  Substance and Sexual Activity   Alcohol use: Not Currently    Comment: rare   Drug use: No   Sexual activity: Yes    Birth control/protection: None  Other Topics Concern   Not on file  Social History Narrative   Not on file   Social Determinants of Health   Financial Resource Strain: Not on file  Food Insecurity: Not on file  Transportation Needs: Not on file  Physical Activity: Not on file  Stress: Not on file  Social Connections: Not on file  Intimate Partner Violence: Not on file    Current Outpatient Medications on File Prior to Visit  Medication Sig Dispense Refill   b complex vitamins capsule Take 1 capsule by mouth daily. Every other day     Cetirizine HCl (ZYRTEC PO) Take by mouth.     Cod Liver Oil 10 MINIM CAPS Take by mouth.  Prenatal Vit-Fe Fumarate-FA (MULTIVITAMIN-PRENATAL) 27-0.8 MG TABS tablet Take 1 tablet by mouth daily at 12 noon.     No current facility-administered medications on file prior to visit.    No Known Allergies   Review of Systems Constitutional: negative for chills, fatigue, fevers and sweats Eyes: negative for irritation, redness and visual disturbance Ears, nose, mouth, throat, and face: negative for hearing loss, nasal congestion, snoring and tinnitus Respiratory: negative for asthma, cough, sputum Cardiovascular: negative for chest pain, dyspnea, exertional chest pressure/discomfort, irregular heart beat, palpitations and syncope Gastrointestinal: negative for abdominal pain, change in bowel habits, nausea and vomiting Genitourinary: negative for abnormal menstrual periods, genital lesions, sexual problems and vaginal discharge, dysuria and urinary incontinence Integument/breast: negative for breast lump, breast tenderness and nipple discharge Hematologic/lymphatic: negative  for bleeding and easy bruising Musculoskeletal:negative for back pain and muscle weakness Neurological: negative for dizziness, headaches, vertigo and weakness Endocrine: negative for diabetic symptoms including polydipsia, polyuria and skin dryness Allergic/Immunologic: negative for hay fever and urticaria Psychologic: positive for mood changes, depressed mood.  Negative for anxiety, hallucinations, suicidal ideation.    Objective:   Blood pressure (!) 149/82, pulse 75, resp. rate 16, height 5\' 6"  (1.676 m), weight 219 lb 8 oz (99.6 kg), currently breastfeeding. Body mass index is 35.43 kg/m.   General Appearance:    Alert, cooperative, no distress, appears stated age, moderate obesity.   Head:    Normocephalic, without obvious abnormality, atraumatic  Eyes:    PERRL, conjunctiva/corneas clear, EOM's intact, both eyes  Ears:    Normal external ear canals, both ears  Nose:   Nares normal, septum midline, mucosa normal, no drainage or sinus tenderness  Throat:   Lips, mucosa, and tongue normal; teeth and gums normal  Neck:   Supple, symmetrical, trachea midline, no adenopathy; thyroid: no enlargement/tenderness/nodules; no carotid bruit or JVD  Back:     Symmetric, no curvature, ROM normal, no CVA tenderness  Lungs:     Clear to auscultation bilaterally, respirations unlabored  Chest Wall:    No tenderness or deformity   Heart:    Regular rate and rhythm, S1 and S2 normal, no murmur, rub or gallop  Breast Exam:    No tenderness, masses, or nipple abnormality  Abdomen:     Soft, non-tender, bowel sounds active all four quadrants, no masses, no organomegaly.    Genitalia:    Pelvic:external genitalia normal, vagina without lesions, discharge, or tenderness, rectovaginal septum  normal. Cervix normal in appearance, no cervical motion tenderness, no adnexal masses or tenderness.  Uterus normal size, shape, mobile, regular contours, nontender.  Rectal:    Normal external sphincter.  No  hemorrhoids appreciated. Internal exam not done.   Extremities:   Extremities normal, atraumatic, no cyanosis or edema  Pulses:   2+ and symmetric all extremities  Skin:   Skin color, texture, turgor normal, no rashes or lesions  Lymph nodes:   Cervical, supraclavicular, and axillary nodes normal  Neurologic:   CNII-XII intact, normal strength, sensation and reflexes throughout   .  Labs:  Lab Results  Component Value Date   WBC 3.5 12/06/2021   HGB 13.9 12/06/2021   HCT 39.8 12/06/2021   MCV 86 12/06/2021   PLT 167 12/06/2021    Lab Results  Component Value Date   CREATININE 0.65 12/06/2021   BUN 9 12/06/2021   NA 139 12/06/2021   K 4.3 12/06/2021   CL 101 12/06/2021   CO2 25 11/01/2021    Lab Results  Component Value Date   ALT 32 12/06/2021   AST 19 12/06/2021   GGT 168 (H) 12/06/2021   ALKPHOS 109 12/06/2021   BILITOT 0.6 12/06/2021    Lab Results  Component Value Date   TSH 1.200 12/06/2021     Assessment:   1. Encounter for well woman exam with routine gynecological exam   2. Postpartum depression   3. Mild obesity   4. Elevated blood pressure reading in office without diagnosis of hypertension      Plan:  1. Encounter for well woman exam with routine gynecological exam - Blood tests: UTD. - Breast self exam technique reviewed and patient encouraged to perform self-exam monthly. - Contraception: tubal ligation. - Mammogram  : Not age appropriate. To begin screenings at age 69.  - Pap smear  UTD . - COVID vaccination status: Completed 2 doses of Moderna series, recommend booster dose.  - Declines flu vaccine.   2. Postpartum depression - Discussed options of management for mild postpartum depression symptoms (PHQ-9 score is 8), including natural supplements vs prescription medication. Patient opts to try natural remedies fist. Given list in AVS. Also encouraged to continue with therapy.   3. Mild obesity - Discussed healthy lifestyle  modifications.  4. Elevated blood pressure reading in office without diagnosis of hypertension - Patient with elevated BP today, no previous h/o HTN. Will follow at next 1-2 visits and reassess.    Follow up in 1 year for annual exam. F/u in 1-2 months for reassessment of mood symptoms.    Rubie Maid, MD [place]

## 2022-04-30 NOTE — Patient Instructions (Addendum)
Herbal Mood Supplements  St. John's wort. This herbal supplement is not approved by the Food and Drug Administration (FDA) to treat depression in the [place] but it's available. Although it may be helpful for mild or moderate depression, use it with caution. St. John's wort can interfere with many medications, including blood-thinning drugs, birth control pills, chemotherapy, HIV/AIDS medications and drugs to prevent organ rejection after a transplant. Also, avoid taking St. John's wort while taking antidepressants -- the combination can cause serious side effects.  SAMe. This dietary supplement is a synthetic form of a chemical that occurs naturally in the body. SAMe (pronounced sam-E) is short for S-adenosylmethionine (es-uh-den-o-sul-muh-THIE-o-neen). SAMe is not approved by the FDA to treat depression in the [place] though it's available. More research is needed to determine if SAMe is helpful for depression. In higher doses, SAMe can cause nausea and constipation. Do not use SAMe if you're taking a prescription antidepressant -- the combination may lead to serious side effects. SAMe may trigger mania in people with bipolar disorder.  Omega-3 fatty acids. These fats are found in cold-water fish, flaxseed, flax oil, walnuts and some other foods. Omega-3 supplements are being studied as a possible treatment for depression and for depressive symptoms in people with bipolar disorder. While considered generally safe, the supplement can have a fishy taste, and in high doses, it may interact with other medications. Although eating foods with omega-3 fatty acids appears to have heart-healthy benefits, more research is needed to determine if it has an effect on preventing or improving depression.  Saffron. Saffron extract may improve symptoms of depression, but more study is needed. High doses can cause significant side effects.  5-HTP. The supplement called 5-hydroxytryptophan (hi-drok-see-TRIP-to-fan), also known  as 5-HTP, may play a role in improving serotonin levels, a chemical that affects mood. But evidence is only preliminary and more research is needed.  DHEA. Dehydroepiandrosterone (dee-hi-droe-ep-e-an-DROS-tur-own), also called DHEA, is a hormone that your body makes. Changes in levels of DHEA have been linked to depression. Several preliminary studies show improvement in depression symptoms when taking DHEA as a dietary supplement, but more research is needed. Although it's usually well-tolerated, DHEA has potentially side effects, especially if used in high doses or long term. DHEA made from soy or wild yam is not effective.  Ashwaganda.  The ashwagandha plant is a small shrub with yellow flowers that's native to [place] and [place]. People use extracts or powder from the plant's root or leaves to treat a variety of conditions, including anxiety and fertility issues. Potential benefits of ashwagandha include better athletic performance and sleep. Some research suggests this herb may help people with conditions like anxiety and infertility, but stronger studies are needed.   

## 2022-04-30 NOTE — Progress Notes (Signed)
Entered in error

## 2022-05-02 ENCOUNTER — Encounter: Payer: 59 | Admitting: Obstetrics and Gynecology

## 2022-06-10 NOTE — Progress Notes (Unsigned)
   OBSTETRICS POSTPARTUM CLINIC PROGRESS NOTE  Subjective:     Sara Burch is a 36 y.o. G75P1031 female who presents for a postpartum visit. She is {1-10:13787} {time; units:18646} postpartum following a {delivery:12449}. I have fully reviewed the prenatal and intrapartum course. The delivery was at *** gestational weeks.  Anesthesia: {anesthesia types:812}. Postpartum course has been ***. Baby's course has been ***. Baby is feeding by {breast/bottle:69}. Bleeding: patient {HAS HAS JSE:83151} not resumed menses, with No LMP recorded.. Bowel function is {normal:32111}. Bladder function is {normal:32111}. Patient {is/is not:9024} sexually active. Contraception method desired is {contraceptive method:5051}. Postpartum depression screening: {neg default:13464::"negative"}.  EDPS score is ***.    The following portions of the patient's history were reviewed and updated as appropriate: allergies, current medications, past family history, past medical history, past social history, past surgical history, and problem list.  Review of Systems {ros; complete:30496}   Objective:    There were no vitals taken for this visit.  General:  alert and no distress   Breasts:  inspection negative, no nipple discharge or bleeding, no masses or nodularity palpable  Lungs: clear to auscultation bilaterally  Heart:  regular rate and rhythm, S1, S2 normal, no murmur, click, rub or gallop  Abdomen: soft, non-tender; bowel sounds normal; no masses,  no organomegaly.  ***Well healed Pfannenstiel incision   Vulva:  normal  Vagina: normal vagina, no discharge, exudate, lesion, or erythema  Cervix:  no cervical motion tenderness and no lesions  Corpus: normal size, contour, position, consistency, mobility, non-tender  Adnexa:  normal adnexa and no mass, fullness, tenderness  Rectal Exam: Not performed.         Labs:  Lab Results  Component Value Date   HGB 13.9 12/06/2021     Assessment:   No diagnosis found.    Plan:    1. Contraception: {method:5051} 2. Will check Hgb for h/o postpartum anemia of less than 10.  3. Follow up in: {1-10:13787} {time; units:19136} or as needed.    Loney Laurence, CMA  OB/GYN

## 2022-06-11 ENCOUNTER — Ambulatory Visit (INDEPENDENT_AMBULATORY_CARE_PROVIDER_SITE_OTHER): Payer: 59 | Admitting: Obstetrics and Gynecology

## 2022-06-11 ENCOUNTER — Encounter: Payer: Self-pay | Admitting: Obstetrics and Gynecology

## 2022-06-11 VITALS — BP 125/87 | HR 80 | Resp 16 | Wt 225.6 lb

## 2022-06-11 DIAGNOSIS — R69 Illness, unspecified: Secondary | ICD-10-CM | POA: Diagnosis not present

## 2022-06-11 DIAGNOSIS — Z0130 Encounter for examination of blood pressure without abnormal findings: Secondary | ICD-10-CM | POA: Diagnosis not present

## 2022-06-11 DIAGNOSIS — F53 Postpartum depression: Secondary | ICD-10-CM

## 2022-09-17 ENCOUNTER — Ambulatory Visit: Payer: 59 | Admitting: Obstetrics and Gynecology

## 2022-11-14 ENCOUNTER — Ambulatory Visit: Payer: Self-pay

## 2022-11-14 DIAGNOSIS — Z0000 Encounter for general adult medical examination without abnormal findings: Secondary | ICD-10-CM

## 2022-11-14 LAB — POCT URINALYSIS DIPSTICK
Bilirubin, UA: NEGATIVE
Blood, UA: POSITIVE
Glucose, UA: NEGATIVE
Ketones, UA: NEGATIVE
Leukocytes, UA: NEGATIVE
Nitrite, UA: NEGATIVE
Protein, UA: NEGATIVE
Spec Grav, UA: <=1.005 — AB (ref 1.010–1.025)
Urobilinogen, UA: 0.2 E.U./dL
pH, UA: 6.5 (ref 5.0–8.0)

## 2022-11-14 NOTE — Progress Notes (Signed)
Fasting labs and urine obtained pre physical CO[facility].

## 2022-11-15 LAB — CMP12+LP+TP+TSH+6AC+CBC/D/PLT
ALT: 16 IU/L (ref 0–32)
AST: 15 IU/L (ref 0–40)
Albumin/Globulin Ratio: 1.6 (ref 1.2–2.2)
Albumin: 4.2 g/dL (ref 3.9–4.9)
Alkaline Phosphatase: 87 IU/L (ref 44–121)
BUN/Creatinine Ratio: 20 (ref 9–23)
BUN: 11 mg/dL (ref 6–20)
Basophils Absolute: 0 10*3/uL (ref 0.0–0.2)
Basos: 1 %
Bilirubin Total: 0.5 mg/dL (ref 0.0–1.2)
Calcium: 9.1 mg/dL (ref 8.7–10.2)
Chloride: 103 mmol/L (ref 96–106)
Chol/HDL Ratio: 4.3 ratio (ref 0.0–4.4)
Cholesterol, Total: 192 mg/dL (ref 100–199)
Creatinine, Ser: 0.56 mg/dL — ABNORMAL LOW (ref 0.57–1.00)
EOS (ABSOLUTE): 0.1 10*3/uL (ref 0.0–0.4)
Eos: 1 %
Estimated CHD Risk: 1 times avg. (ref 0.0–1.0)
Free Thyroxine Index: 1.7 (ref 1.2–4.9)
GGT: 16 IU/L (ref 0–60)
Globulin, Total: 2.6 g/dL (ref 1.5–4.5)
Glucose: 100 mg/dL — ABNORMAL HIGH (ref 70–99)
HDL: 45 mg/dL (ref >39)
Hematocrit: 37.8 % (ref 34.0–46.6)
Hemoglobin: 12.6 g/dL (ref 11.1–15.9)
Immature Grans (Abs): 0 10*3/uL (ref 0.0–0.1)
Immature Granulocytes: 0 %
Iron: 71 ug/dL (ref 27–159)
LDH: 168 IU/L (ref 119–226)
LDL Chol Calc (NIH): 136 mg/dL — ABNORMAL HIGH (ref 0–99)
Lymphocytes Absolute: 2 10*3/uL (ref 0.7–3.1)
Lymphs: 47 %
MCH: 29.4 pg (ref 26.6–33.0)
MCHC: 33.3 g/dL (ref 31.5–35.7)
MCV: 88 fL (ref 79–97)
Monocytes Absolute: 0.4 10*3/uL (ref 0.1–0.9)
Monocytes: 10 %
Neutrophils Absolute: 1.7 10*3/uL (ref 1.4–7.0)
Neutrophils: 41 %
Phosphorus: 2.8 mg/dL — ABNORMAL LOW (ref 3.0–4.3)
Platelets: 192 10*3/uL (ref 150–450)
Potassium: 4.2 mmol/L (ref 3.5–5.2)
RBC: 4.29 x10E6/uL (ref 3.77–5.28)
RDW: 13.3 % (ref 11.7–15.4)
Sodium: 137 mmol/L (ref 134–144)
T3 Uptake Ratio: 30 % (ref 24–39)
T4, Total: 5.6 ug/dL (ref 4.5–12.0)
TSH: 1.17 u[IU]/mL (ref 0.450–4.500)
Total Protein: 6.8 g/dL (ref 6.0–8.5)
Triglycerides: 60 mg/dL (ref 0–149)
Uric Acid: 4 mg/dL (ref 2.6–6.2)
VLDL Cholesterol Cal: 11 mg/dL (ref 5–40)
WBC: 4.2 10*3/uL (ref 3.4–10.8)
eGFR: 120 mL/min/{1.73_m2} (ref >59)

## 2022-11-19 ENCOUNTER — Encounter: Payer: Self-pay | Admitting: Physician Assistant

## 2022-11-19 ENCOUNTER — Ambulatory Visit: Payer: Self-pay | Admitting: Physician Assistant

## 2022-11-19 VITALS — BP 132/84 | HR 88 | Resp 16 | Ht 66.0 in | Wt 244.0 lb

## 2022-11-19 DIAGNOSIS — Z0000 Encounter for general adult medical examination without abnormal findings: Secondary | ICD-10-CM

## 2022-11-19 NOTE — Progress Notes (Signed)
Pt presents today to complete physical no issues or concerns at this time/CL,RMA

## 2022-11-19 NOTE — Progress Notes (Signed)
[facility] ____________________________________________   None    (approximate)  I have reviewed the triage vital signs and the nursing notes.   HISTORY  Chief Complaint Annual Exam  HPI Sara Burch is a 37 y.o. female patient presents for annual physical exam.  Patient voiced no concerns or complaints.         Past Medical History:  Diagnosis Date   Miscarriage     Patient Active Problem List   Diagnosis Date Noted   Abnormal LFTs 11/07/2021   Calculus of gallbladder without cholecystitis without obstruction 11/05/2021   H/O fibromyalgia 12/14/2019   Carpal tunnel syndrome, bilateral 05/26/2012   Family history of thyroid disorder 01/03/2012    Past Surgical History:  Procedure Laterality Date   CESAREAN SECTION  09/25/2021   Procedure: CESAREAN SECTION;  Surgeon: Hildred Laser, MD;  Location: [facility] ORS;  Service: Obstetrics;;    Prior to Admission medications   Medication Sig Start Date End Date Taking? Authorizing Provider  b complex vitamins capsule Take 1 capsule by mouth daily. Every other day    [provider]  Cetirizine HCl (ZYRTEC PO) Take by mouth.    [provider]  [facility] Liver Oil 10 MINIM CAPS Take by mouth.    [provider]  Prenatal Vit-Fe Fumarate-FA (MULTIVITAMIN-PRENATAL) 27-0.8 MG TABS tablet Take 1 tablet by mouth daily at 12 noon.    [provider]    Allergies Patient has no known allergies.  Family History  Problem Relation Age of Onset   Healthy Mother    Diabetes Father    Breast cancer Neg Hx    Ovarian cancer Neg Hx    Colon cancer Neg Hx     Social History Social History   Tobacco Use   Smoking status: Never    Passive exposure: Never   Smokeless tobacco: Never  Vaping Use   Vaping Use: Never used  Substance Use Topics   Alcohol use: Not Currently    Comment: rare   Drug use: No    Review of Systems Constitutional: No  fever/chills Eyes: No visual changes. ENT: No sore throat. Cardiovascular: Denies chest pain. Respiratory: Denies shortness of breath. Gastrointestinal: No abdominal pain.  No nausea, no vomiting.  No diarrhea.  No constipation. Genitourinary: Negative for dysuria. Musculoskeletal: Negative for back pain. Skin: Negative for rash. Neurological: Negative for headaches, focal weakness or numbness.  ____________________________________________   PHYSICAL EXAM: VITAL SIGNS: BP 132/84  Pulse 88  Resp 16  SpO2 100 %  Weight 244 lb (110.7 kg)  Height 5\' 6"  (1.676 m)   BMI 39.38 kg/m2  BSA 2.27 m2    Constitutional: Alert and oriented. Well appearing and in no acute distress. Eyes: Conjunctivae are normal. PERRL. EOMI. Head: Atraumatic. Nose: No congestion/rhinnorhea. Mouth/Throat: Mucous membranes are moist.  Oropharynx non-erythematous. Neck: No stridor.  No cervical spine tenderness to palpation. Hematological/Lymphatic/Immunilogical: No cervical lymphadenopathy. Cardiovascular: Normal rate, regular rhythm. Grossly normal heart sounds.  Good peripheral circulation. Respiratory: Normal respiratory effort.  No retractions. Lungs CTAB. Gastrointestinal: Soft and nontender.  Distention secondary to body habitus. No abdominal bruits. No CVA tenderness. Genitourinary: Deferred Musculoskeletal: No lower extremity tenderness nor edema.  No joint effusions. Neurologic:  Normal speech and language. No gross focal neurologic deficits are appreciated. No gait instability. Skin:  Skin is warm, dry and intact. No rash noted. Psychiatric: Mood and affect are normal. Speech and behavior are normal.  ____________________________________________   Vickie Epley  Component Ref Range & Units 5 d ago (11/14/22) 11 mo ago (12/06/21) 1 yr ago (11/01/21) 1 yr ago (09/20/21) 1 yr ago (09/10/21) 1 yr ago (09/03/21) 1 yr ago (08/15/21)  Color, UA yellow yellow  bright yellow yellow  yellow  Clarity,  UA clear clear  clear cloudy  clear  Glucose, UA Negative Negative Negative       Bilirubin, UA neg negative  neg neg Negative neg  Ketones, UA neg negative  neg neg Negative neg  Spec Grav, UA 1.010 - 1.025 <=1.005 Abnormal  1.025  1.010 1.015 1.015 1.015  Blood, UA pos 1+  neg mod Trace neg  Comment: 10  pH, UA 5.0 - 8.0 6.5 6.0  7.0 6.5 6.5 6.0  Protein, UA Negative Negative Positive Abnormal  CM       Urobilinogen, UA 0.2 or 1.0 E.U./dL 0.2 0.2  0.2 0.2 0.2 0.2  Nitrite, UA neg negative  neg neg Negative neg  Leukocytes, UA Negative Negative Moderate (2+) Abnormal   Small (1+) Abnormal  Moderate (2+) Abnormal  Moderate (2+) Abnormal  Negative  Appearance dark  HAZY Abnormal  R    neg  Odor none        Resulting Agency   [facility]                    Component Ref Range & Units 5 d ago (11/14/22) 11 mo ago (12/06/21) 1 yr ago (11/07/21) 1 yr ago (11/01/21) 1 yr ago (11/01/21) 1 yr ago (11/01/21) 1 yr ago (11/01/21)  Glucose 70 - 99 mg/dL 161 High  88    096 High  CM   Uric Acid 2.6 - 6.2 mg/dL 4.0 4.3 CM       Comment:            Therapeutic target for gout patients: <6.0  BUN 6 - 20 mg/dL 11 9    9    Creatinine, Ser 0.57 - 1.00 mg/dL 0.45 Low  4.09    8.11 R   eGFR >59 mL/min/1.73 120 117       BUN/Creatinine Ratio 9 - 23 20 14        Sodium 134 - 144 mmol/L 137 139    137 R   Potassium 3.5 - 5.2 mmol/L 4.2 4.3    4.0 R   Chloride 96 - 106 mmol/L 103 101    103 R   Calcium 8.7 - 10.2 mg/dL 9.1 9.4    9.8 R   Phosphorus 3.0 - 4.3 mg/dL 2.8 Low  3.1       Total Protein 6.0 - 8.5 g/dL 6.8 7.0 7.7 R 7.5 R 7.4 R    Albumin 3.9 - 4.9 g/dL 4.2 4.5 R 3.9 R 3.9 R 3.9 R    Globulin, Total 1.5 - 4.5 g/dL 2.6 2.5       Albumin/Globulin Ratio 1.2 - 2.2 1.6 1.8       Bilirubin Total 0.0 - 1.2 mg/dL 0.5 0.6 0.5 R 0.9 R 0.9 R    Alkaline Phosphatase 44 - 121 IU/L 87 109 74 R 99 R 83 R    LDH 119 - 226 IU/L 168 158       AST 0 - 40 IU/L 15 19 22  R 517 High  R 607  High  R    ALT 0 - 32 IU/L 16 32 99 High  R 734 High  R 448 High  R    GGT  0 - 60 IU/L 16 168 High        Iron 27 - 159 ug/dL 71 75       Cholesterol, Total 100 - 199 mg/dL 161 096       Triglycerides 0 - 149 mg/dL 60 56       HDL >04 mg/dL 45 49       VLDL Cholesterol Cal 5 - 40 mg/dL 11 11       LDL Chol Calc (NIH) 0 - 99 mg/dL 540 High  981 High        Chol/HDL Ratio 0.0 - 4.4 ratio 4.3 3.5 CM       Comment:                                   T. Chol/HDL Ratio                                             Men  Women                               1/2 Avg.Risk  3.4    3.3                                   Avg.Risk  5.0    4.4                                2X Avg.Risk  9.6    7.1                                3X Avg.Risk 23.4   11.0  Estimated CHD Risk 0.0 - 1.0 times avg. 1.0 0.6 CM       Comment: The CHD Risk is based on the T. Chol/HDL ratio. Other factors affect CHD Risk such as hypertension, smoking, diabetes, severe obesity, and family history of premature CHD.  TSH 0.450 - 4.500 uIU/mL 1.170 1.200       T4, Total 4.5 - 12.0 ug/dL 5.6 6.2       T3 Uptake Ratio 24 - 39 % 30 35       Free Thyroxine Index 1.2 - 4.9 1.7 2.2       WBC 3.4 - 10.8 x10E3/uL 4.2 3.5     5.1 R  RBC 3.77 - 5.28 x10E6/uL 4.29 4.65     4.60 R  Hemoglobin 11.1 - 15.9 g/dL 19.1 47.8     29.5 R  Hematocrit 34.0 - 46.6 % 37.8 39.8     41.2 R  MCV 79 - 97 fL 88 86     89.6 R  MCH 26.6 - 33.0 pg 29.4 29.9     28.5 R  MCHC 31.5 - 35.7 g/dL 62.1 30.8     65.7 R  RDW 11.7 - 15.4 % 13.3 14.4     14.3 R  Platelets 150 - 450 x10E3/uL 192 167     150 R  Neutrophils Not Estab. % 41 36       Lymphs Not Estab. %  47 52       Monocytes Not Estab. % 10 10       Eos Not Estab. % 1 2       Basos Not Estab. % 1 0       Neutrophils Absolute 1.4 - 7.0 x10E3/uL 1.7 1.3 Low        Lymphocytes Absolute 0.7 - 3.1 x10E3/uL 2.0 1.8       Monocytes Absolute 0.1 - 0.9 x10E3/uL 0.4 0.4       EOS  (ABSOLUTE) 0.0 - 0.4 x10E3/uL 0.1 0.1       Basophils Absolute 0.0 - 0.2 x10E3/uL 0.0 0.0       Immature Granulocytes Not Estab. % 0 0       Immature Grans (Abs)                 ____________________________________________   ____________________________________________   INITIAL IMPRESSION / ASSESSMENT AND PLAN  As part of my medical decision making, I reviewed the following data within the electronic MEDICAL RECORD NUMBER       No acute findings on physical exam and labs.     ____________________________________________   FINAL CLINICAL IMPRESSION    ED Discharge Orders     None        Note:  This document was prepared using Dragon voice recognition software and may include unintentional dictation errors.

## 2023-01-10 IMAGING — MR MR ABDOMEN WO/W CM MRCP
19 of 21 series · 44 of 48 positions shown · IV contrast (gadavist)
Comparison: Ultrasound evaluation of the same date.

CLINICAL DATA: RIGHT upper quadrant abdominal pain with
nondiagnostic ultrasound in a 36-year-old female.

EXAM:
MRI ABDOMEN WITHOUT AND WITH CONTRAST (INCLUDING MRCP)
TECHNIQUE: Multiplanar multisequence MR imaging of the abdomen was performed
both before and after the administration of intravenous contrast.
Heavily T2-weighted images of the biliary and pancreatic ducts were
obtained, and three-dimensional MRCP images were rendered by post
processing.
CONTRAST:  10mL GADAVIST GADOBUTROL 1 MMOL/ML IV SOLN

[Series 3: T2 · coronal · 6.0mm · 1.19mm/px · 1 of 30 slices shown (1 of 2)]
[im 1/30]
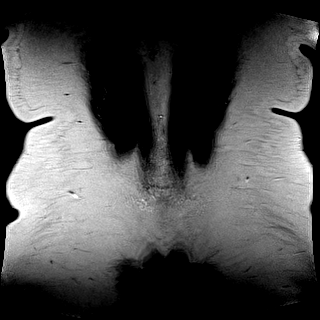

[Series 4: T2 · axial · 6.0mm · 1.19mm/px · 1 of 32 slices shown (2 of 2)]
[im 1/32]
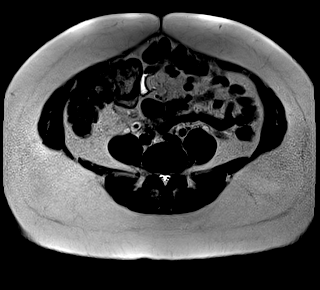

[Series 5: T1 · axial · 3.0mm · 1.19mm/px · z∈[-122,+91]mm · 2 of 72 slices shown (1 of 2)]
[im 1/72]
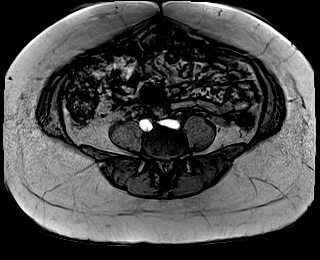
[im 72/72]
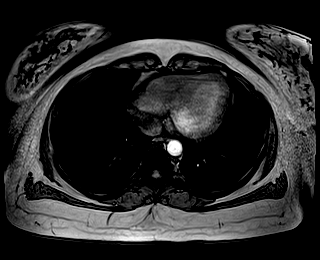

[Series 6: T1 · axial · 3.0mm · 1.19mm/px · z∈[-122,+91]mm · 2 of 72 slices shown (2 of 2)]
[im 1/72]
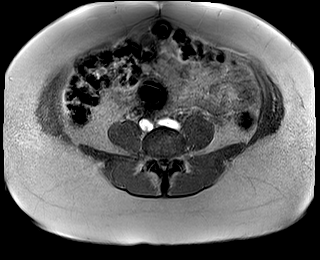
[im 72/72]
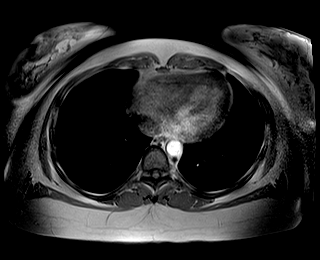

[Series 9: T2 fat-sat · axial · 6.0mm · 1.19mm/px · 1 of 32 slices shown]
[im 1/32]
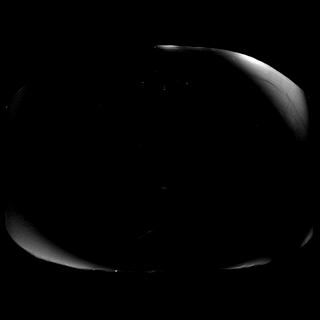

[Series 10: ax dwi_tracew · axial · 6.0mm · 1.42mm/px · z∈[-79,+144]mm · 4 of 96 slices shown]
[im 1/96]
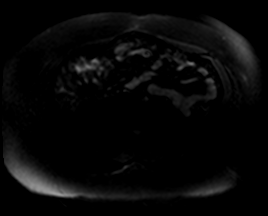
[im 32/96]
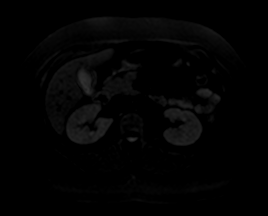
[im 64/96]
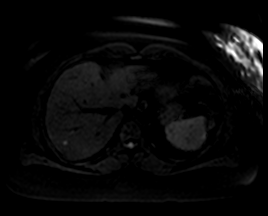
[im 96/96]
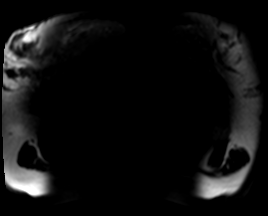

[Series 11: ax dwi_adc · axial · 6.0mm · 1.42mm/px · 1 of 32 slices shown]
[im 1/32]
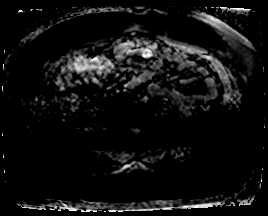

[Series 14: MRCP · coronal · 3.0mm · 1.12mm/px · 1 of 17 slices shown]
[im 1/17]
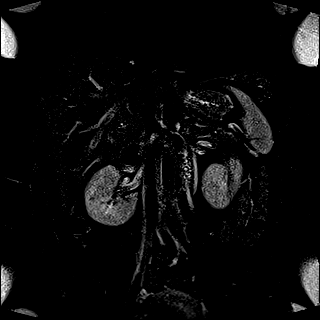

[Series 18: radials · coronal · 50.0mm · 0.78mm/px · 1 of 5 slices shown]
[im 1/5]
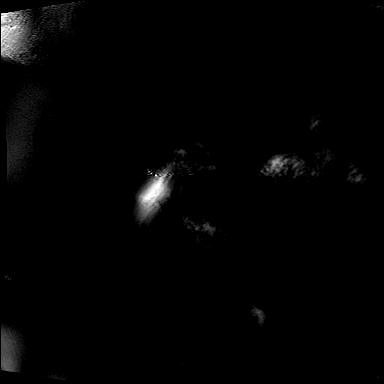

[Series 19: T1 dynamic fat-sat · axial · non-contrast · 3.0mm · 1.19mm/px · z∈[-122,+91]mm · 3 of 72 slices shown (1 of 5)]
[im 1/72]
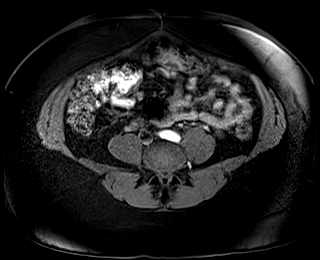
[im 36/72]
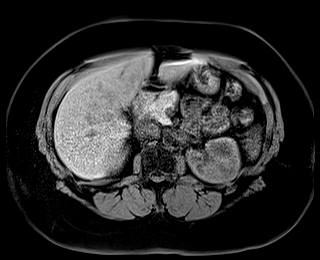
[im 72/72]
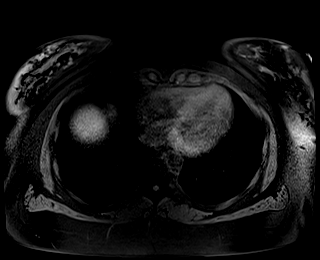

[Series 22: T1 dynamic fat-sat post-contrast · axial · 3.0mm · 1.19mm/px · z∈[-122,+91]mm · 3 of 72 slices shown (1 of 4)]
[im 1/72]
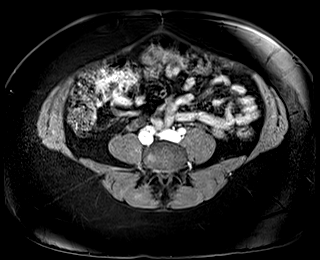
[im 36/72]
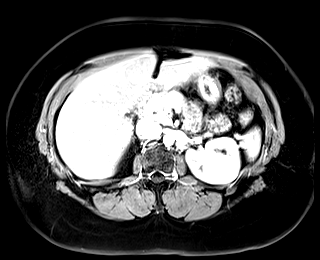
[im 72/72]
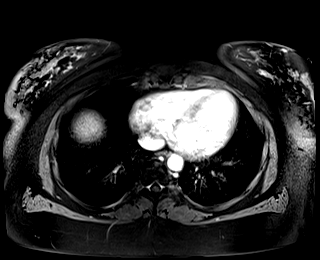

[Series 23: T1 dynamic fat-sat · axial · 3.0mm · 1.19mm/px · z∈[-122,+91]mm · 3 of 72 slices shown (2 of 5)]
[im 1/72]
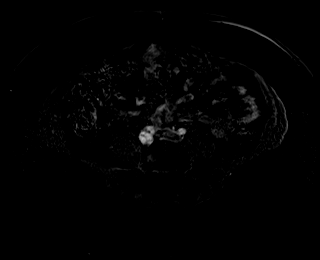
[im 36/72]
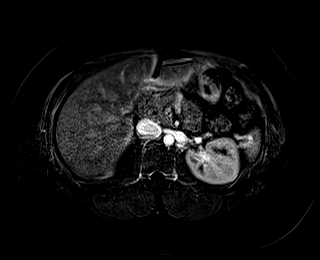
[im 72/72]
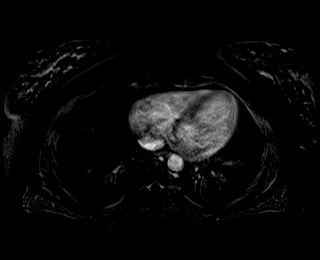

[Series 24: T1 dynamic fat-sat post-contrast · axial · 3.0mm · 1.19mm/px · z∈[-122,+91]mm · 3 of 72 slices shown (2 of 4)]
[im 1/72]
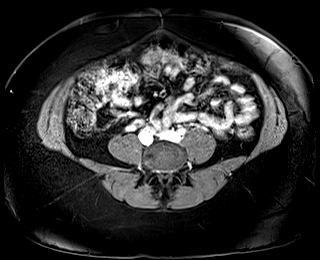
[im 36/72]
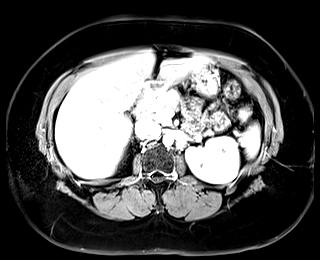
[im 72/72]
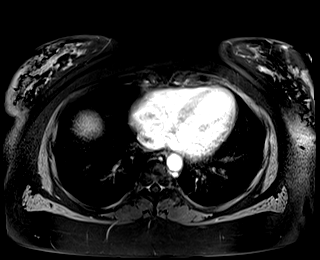

[Series 25: T1 dynamic fat-sat · axial · 3.0mm · 1.19mm/px · z∈[-122,+91]mm · 3 of 72 slices shown (3 of 5)]
[im 1/72]
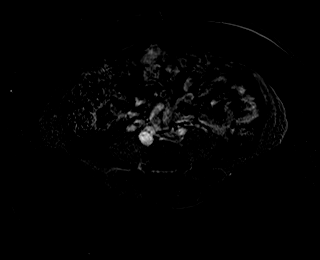
[im 36/72]
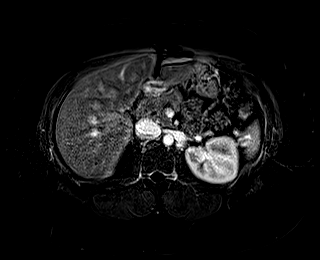
[im 72/72]
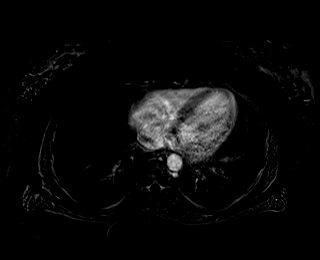

[Series 26: T1 dynamic fat-sat post-contrast · axial · 3.0mm · 1.19mm/px · z∈[-122,+91]mm · 3 of 72 slices shown (3 of 4)]
[im 1/72]
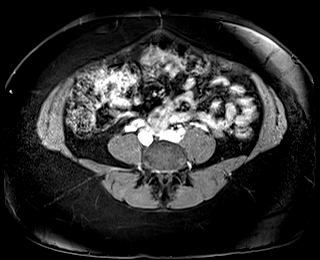
[im 36/72]
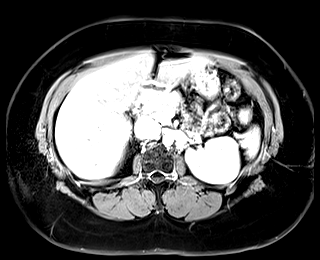
[im 72/72]
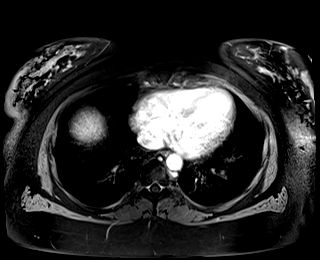

[Series 27: T1 dynamic fat-sat · axial · 3.0mm · 1.19mm/px · z∈[-122,+91]mm · 3 of 72 slices shown (4 of 5)]
[im 1/72]
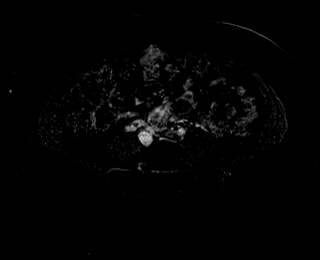
[im 36/72]
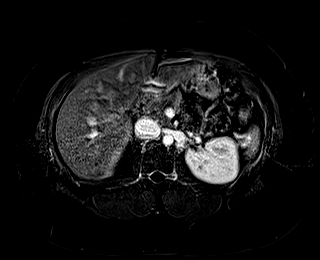
[im 72/72]
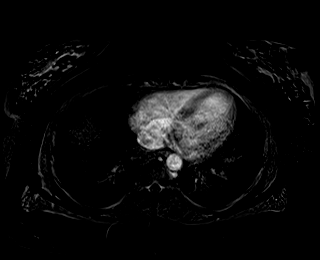

[Series 28: T1 dynamic post-contrast · coronal · 3.0mm · 1.31mm/px · 3 of 72 slices shown]
[im 1/72]
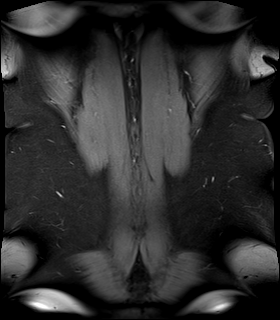
[im 36/72]
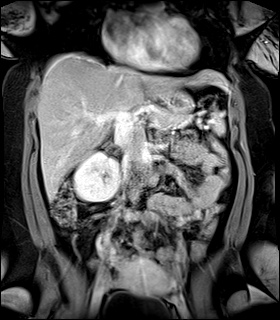
[im 72/72]
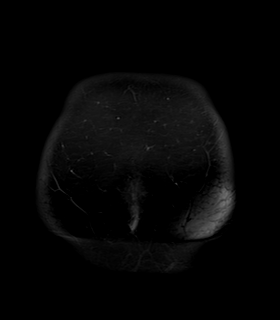

[Series 29: T1 dynamic fat-sat post-contrast · axial · 3.0mm · 1.19mm/px · z∈[-122,+91]mm · 3 of 72 slices shown (4 of 4)]
[im 1/72]
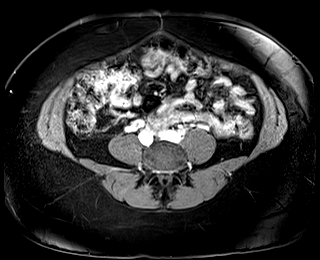
[im 36/72]
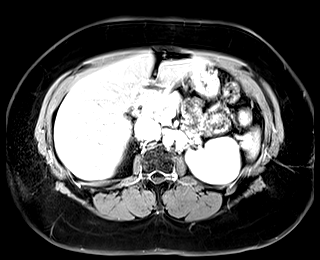
[im 72/72]
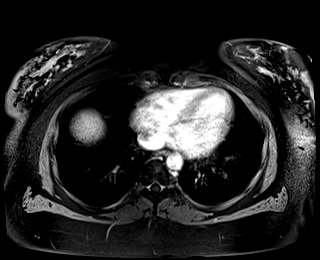

[Series 30: T1 dynamic fat-sat · axial · 3.0mm · 1.19mm/px · z∈[-122,+91]mm · 3 of 72 slices shown (5 of 5)]
[im 1/72]
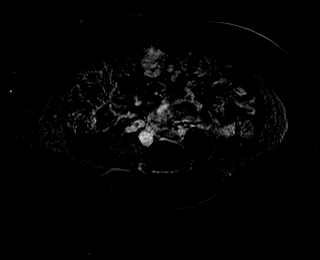
[im 36/72]
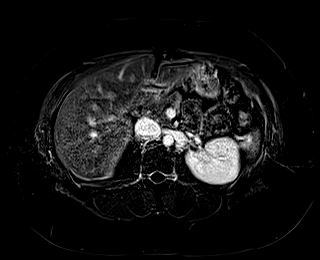
[im 72/72]
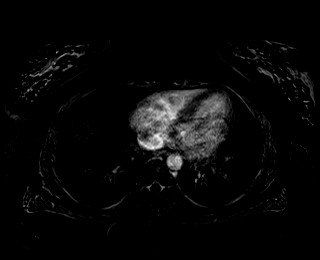

[44 of 48 positions shown; findings below may reference images not displayed]

FINDINGS: Lower chest: No effusion or consolidation. Limited assessment of the
lung bases on MRI.

Hepatobiliary: Mild to moderate hepatic steatosis. No focal,
suspicious hepatic lesion. Small volume pericholecystic fluid. No
wall thickening or hyperenhancement of the gallbladder wall.
Numerous gallstones layer dependently in the gallbladder.

No filling defect in the biliary tree beyond the gallbladder. No
current evidence of biliary duct dilation.

Pancreas: Normal intrinsic T1 signal. No ductal dilation or sign of
inflammation. No focal lesion.

Spleen:  Normal.

Adrenals/Urinary Tract: Normal adrenal glands. No focal, suspicious
renal lesion. No hydronephrosis. No perinephric stranding.

Stomach/Bowel: No acute gastrointestinal process to the extent
evaluated on this abdominal MRI.

Vascular/Lymphatic: No pathologically enlarged lymph nodes
identified. No abdominal aortic aneurysm demonstrated.

Other:  No ascites

Musculoskeletal: No suspicious bone lesions identified.
IMPRESSION: 1. Numerous gallstones layer dependently in the gallbladder. Small
volume pericholecystic fluid. No wall thickening or hyperenhancement
of the gallbladder wall. Findings more likely related to underlying
hepatic dysfunction than gallbladder pathology given the absence of
wall thickening or enhancement currently. Would correlate with
symptoms and consider HIDA as clinically warranted.
2. No current evidence of biliary duct dilation. No
choledocholithiasis.
3. Mild to moderate hepatic steatosis.

## 2023-05-04 NOTE — Progress Notes (Unsigned)
GYNECOLOGY ANNUAL PHYSICAL EXAM PROGRESS NOTE  Subjective:    Sara Burch is a 37 y.o. G32P1031 female who presents for an annual exam.  The patient {is/is not/has never been:13135} sexually active. The patient participates in regular exercise: {yes/no/not asked:9010}. Has the patient ever been transfused or tattooed?: {yes/no/not asked:9010}. The patient reports that there {is/is not:9024} domestic violence in her life.   The patient has the following complaints today:   Menstrual History: Menarche age: *** No LMP recorded.     Gynecologic History:  Contraception: {method:5051} History of STI's: Pt denied  Last Pap: 04/09/2021. Results were: normal.  Last mammogram: ***. Results were: {norm/abn:16337}       OB History  Gravida Para Term Preterm AB Living  5 1 1  0 3 1  SAB IAB Ectopic Multiple Live Births  2 0 0 0 1    # Outcome Date GA Lbr Len/2nd Weight Sex Type Anes PTL Lv  5 Gravida           4 SAB 2022          3 SAB 2022          2 Term 2014   9 lb 1.8 oz (4.132 kg)  Vag-Spont   LIV  1 AB 2004            Past Medical History:  Diagnosis Date   Miscarriage     Past Surgical History:  Procedure Laterality Date   CESAREAN SECTION  09/25/2021   Procedure: CESAREAN SECTION;  Surgeon: Hildred Laser, MD;  Location: [facility] ORS;  Service: Obstetrics;;    Family History  Problem Relation Age of Onset   Healthy Mother    Diabetes Father    Breast cancer Neg Hx    Ovarian cancer Neg Hx    Colon cancer Neg Hx     Social History   Socioeconomic History   Marital status: Married    Spouse name: Not on file   Number of children: Not on file   Years of education: Not on file   Highest education level: Not on file  Occupational History   Not on file  Tobacco Use   Smoking status: Never    Passive exposure: Never   Smokeless tobacco: Never  Vaping Use   Vaping status: Never Used  Substance and Sexual Activity   Alcohol use: Not Currently     Comment: rare   Drug use: No   Sexual activity: Yes    Birth control/protection: None  Other Topics Concern   Not on file  Social History Narrative   Not on file   Social Determinants of Health   Financial Resource Strain: Not on file  Food Insecurity: Not on file  Transportation Needs: Not on file  Physical Activity: Not on file  Stress: Not on file  Social Connections: Not on file  Intimate Partner Violence: Not on file    Current Outpatient Medications on File Prior to Visit  Medication Sig Dispense Refill   b complex vitamins capsule Take 1 capsule by mouth daily. Every other day     Cetirizine HCl (ZYRTEC PO) Take by mouth.     Cod Liver Oil 10 MINIM CAPS Take by mouth.     Prenatal Vit-Fe Fumarate-FA (MULTIVITAMIN-PRENATAL) 27-0.8 MG TABS tablet Take 1 tablet by mouth daily at 12 noon.     No current facility-administered medications on file prior to visit.    No Known Allergies   Review of Systems Constitutional:  negative for chills, fatigue, fevers and sweats Eyes: negative for irritation, redness and visual disturbance Ears, nose, mouth, throat, and face: negative for hearing loss, nasal congestion, snoring and tinnitus Respiratory: negative for asthma, cough, sputum Cardiovascular: negative for chest pain, dyspnea, exertional chest pressure/discomfort, irregular heart beat, palpitations and syncope Gastrointestinal: negative for abdominal pain, change in bowel habits, nausea and vomiting Genitourinary: negative for abnormal menstrual periods, genital lesions, sexual problems and vaginal discharge, dysuria and urinary incontinence Integument/breast: negative for breast lump, breast tenderness and nipple discharge Hematologic/lymphatic: negative for bleeding and easy bruising Musculoskeletal:negative for back pain and muscle weakness Neurological: negative for dizziness, headaches, vertigo and weakness Endocrine: negative for diabetic symptoms including  polydipsia, polyuria and skin dryness Allergic/Immunologic: negative for hay fever and urticaria      Objective:  currently breastfeeding. There is no height or weight on file to calculate BMI.    General Appearance:    Alert, cooperative, no distress, appears stated age  Head:    Normocephalic, without obvious abnormality, atraumatic  Eyes:    PERRL, conjunctiva/corneas clear, EOM's intact, both eyes  Ears:    Normal external ear canals, both ears  Nose:   Nares normal, septum midline, mucosa normal, no drainage or sinus tenderness  Throat:   Lips, mucosa, and tongue normal; teeth and gums normal  Neck:   Supple, symmetrical, trachea midline, no adenopathy; thyroid: no enlargement/tenderness/nodules; no carotid bruit or JVD  Back:     Symmetric, no curvature, ROM normal, no CVA tenderness  Lungs:     Clear to auscultation bilaterally, respirations unlabored  Chest Wall:    No tenderness or deformity   Heart:    Regular rate and rhythm, S1 and S2 normal, no murmur, rub or gallop  Breast Exam:    No tenderness, masses, or nipple abnormality  Abdomen:     Soft, non-tender, bowel sounds active all four quadrants, no masses, no organomegaly.    Genitalia:    Pelvic:external genitalia normal, vagina without lesions, discharge, or tenderness, rectovaginal septum  normal. Cervix normal in appearance, no cervical motion tenderness, no adnexal masses or tenderness.  Uterus normal size, shape, mobile, regular contours, nontender.  Rectal:    Normal external sphincter.  No hemorrhoids appreciated. Internal exam not done.   Extremities:   Extremities normal, atraumatic, no cyanosis or edema  Pulses:   2+ and symmetric all extremities  Skin:   Skin color, texture, turgor normal, no rashes or lesions  Lymph nodes:   Cervical, supraclavicular, and axillary nodes normal  Neurologic:   CNII-XII intact, normal strength, sensation and reflexes throughout   .  Labs:  Lab Results  Component Value Date    WBC 4.2 11/14/2022   HGB 12.6 11/14/2022   HCT 37.8 11/14/2022   MCV 88 11/14/2022   PLT 192 11/14/2022    Lab Results  Component Value Date   CREATININE 0.56 (L) 11/14/2022   BUN 11 11/14/2022   NA 137 11/14/2022   K 4.2 11/14/2022   CL 103 11/14/2022   CO2 25 11/01/2021    Lab Results  Component Value Date   ALT 16 11/14/2022   AST 15 11/14/2022   GGT 16 11/14/2022   ALKPHOS 87 11/14/2022   BILITOT 0.5 11/14/2022    Lab Results  Component Value Date   TSH 1.170 11/14/2022     Assessment:   No diagnosis found.   Plan:  Blood tests: {blood tests:13147}. Breast self exam technique reviewed and patient encouraged to perform self-exam  monthly. Contraception: {contraceptive methods:5051}. Discussed healthy lifestyle modifications. Mammogram {discussed/ordered:14545} Pap smear {discussed/ordered:14545}. Flu vaccine: Follow up in 1 year for annual exam   Loney Laurence, [facility]

## 2023-05-05 ENCOUNTER — Ambulatory Visit (INDEPENDENT_AMBULATORY_CARE_PROVIDER_SITE_OTHER): Payer: 59 | Admitting: Obstetrics and Gynecology

## 2023-05-05 ENCOUNTER — Encounter: Payer: Self-pay | Admitting: Obstetrics and Gynecology

## 2023-05-05 VITALS — BP 121/82 | HR 80 | Ht 66.0 in | Wt 244.0 lb

## 2023-05-05 DIAGNOSIS — Z01419 Encounter for gynecological examination (general) (routine) without abnormal findings: Secondary | ICD-10-CM

## 2023-05-05 DIAGNOSIS — E669 Obesity, unspecified: Secondary | ICD-10-CM

## 2023-05-05 DIAGNOSIS — N941 Unspecified dyspareunia: Secondary | ICD-10-CM

## 2023-05-05 DIAGNOSIS — Z0000 Encounter for general adult medical examination without abnormal findings: Secondary | ICD-10-CM

## 2023-05-05 DIAGNOSIS — N920 Excessive and frequent menstruation with regular cycle: Secondary | ICD-10-CM

## 2023-05-05 MED ORDER — TRANEXAMIC ACID 650 MG PO TABS: 1300.0000 mg | ORAL_TABLET | Freq: Three times a day (TID) | ORAL | 2 refills | Status: AC

## 2023-05-05 NOTE — Patient Instructions (Addendum)
Preventive Care 22-37 Years Old, Female Preventive care refers to lifestyle choices and visits with your health care provider that can promote health and wellness. Preventive care visits are also called wellness exams. What can I expect for my preventive care visit? Counseling During your preventive care visit, your health care provider may ask about your: Medical history, including: Past medical problems. Family medical history. Pregnancy history. Current health, including: Menstrual cycle. Method of birth control. Emotional well-being. Home life and relationship well-being. Sexual activity and sexual health. Lifestyle, including: Alcohol, nicotine or tobacco, and drug use. Access to firearms. Diet, exercise, and sleep habits. Work and work Astronomer. Sunscreen use. Safety issues such as seatbelt and bike helmet use. Physical exam Your health care provider may check your: Height and weight. These may be used to calculate your BMI (body mass index). BMI is a measurement that tells if you are at a healthy weight. Waist circumference. This measures the distance around your waistline. This measurement also tells if you are at a healthy weight and may help predict your risk of certain diseases, such as type 2 diabetes and high blood pressure. Heart rate and blood pressure. Body temperature. Skin for abnormal spots. What immunizations do I need?  Vaccines are usually given at various ages, according to a schedule. Your health care provider will recommend vaccines for you based on your age, medical history, and lifestyle or other factors, such as travel or where you work. What tests do I need? Screening Your health care provider may recommend screening tests for certain conditions. This may include: Pelvic exam and Pap test. Lipid and cholesterol levels. Diabetes screening. This is done by checking your blood sugar (glucose) after you have not eaten for a while (fasting). Hepatitis  B test. Hepatitis C test. HIV (human immunodeficiency virus) test. STI (sexually transmitted infection) testing, if you are at risk. BRCA-related cancer screening. This may be done if you have a family history of breast, ovarian, tubal, or peritoneal cancers. Talk with your health care provider about your test results, treatment options, and if necessary, the need for more tests. Follow these instructions at home: Eating and drinking  Eat a healthy diet that includes fresh fruits and vegetables, whole grains, lean protein, and low-fat dairy products. Take vitamin and mineral supplements as recommended by your health care provider. Do not drink alcohol if: Your health care provider tells you not to drink. You are pregnant, may be pregnant, or are planning to become pregnant. If you drink alcohol: Limit how much you have to 0-1 drink a day. Know how much alcohol is in your drink. In the [place] one drink equals one 12 oz bottle of beer (355 mL), one 5 oz glass of wine (148 mL), or one 1 oz glass of hard liquor (44 mL). Lifestyle Brush your teeth every morning and night with fluoride toothpaste. Floss one time each day. Exercise for at least 30 minutes 5 or more days each week. Do not use any products that contain nicotine or tobacco. These products include cigarettes, chewing tobacco, and vaping devices, such as e-cigarettes. If you need help quitting, ask your health care provider. Do not use drugs. If you are sexually active, practice safe sex. Use a condom or other form of protection to prevent STIs. If you do not wish to become pregnant, use a form of birth control. If you plan to become pregnant, see your health care provider for a prepregnancy visit. Find healthy ways to manage stress, such as: Meditation,  yoga, or listening to music. Journaling. Talking to a trusted person. Spending time with friends and family. Minimize exposure to UV radiation to reduce your risk of skin  cancer. Safety Always wear your seat belt while driving or riding in a vehicle. Do not drive: If you have been drinking alcohol. Do not ride with someone who has been drinking. If you have been using any mind-altering substances or drugs. While texting. When you are tired or distracted. Wear a helmet and other protective equipment during sports activities. If you have firearms in your house, make sure you follow all gun safety procedures. Seek help if you have been physically or sexually abused. What's next? Go to your health care provider once a year for an annual wellness visit. Ask your health care provider how often you should have your eyes and teeth checked. Stay up to date on all vaccines. This information is not intended to replace advice given to you by your health care provider. Make sure you discuss any questions you have with your health care provider. Document Revised: 12/19/2020 Document Reviewed: 12/19/2020 Elsevier Patient Education  2024 [employer] Inc[employer]     Breast Self-Awareness Breast self-awareness means being familiar with how your breasts look and feel. It involves checking your breasts regularly and telling your health care provider about any changes. Practicing breast self-awareness helps to maintain breast health. Sometimes, changes are not harmful (are benign). Other times, a change in your breasts can be a sign of a serious medical problem. Being familiar with the look and feel of your breasts can help you catch a breast problem while it is still small and can be treated. You should do breast self-exams even if you have breast implants. What you need: A mirror. A well-lit room. A pillow or other soft object. How to do a breast self-exam A breast self-exam is one way to learn what is normal for your breasts and whether your breasts are changing. To do a breast self-exam: Look for changes  Remove all the clothing above your waist. Stand in front of a mirror in  a room with good lighting. Put your hands down at your sides. Compare your breasts in the mirror. Look for differences between them (asymmetry), such as: Differences in shape. Differences in size. Puckers, dips, and bumps in one breast and not the other. Look at each breast for changes in the skin, such as: Redness. Scaly areas. Skin thickening. Dimpling. Open sores (ulcers). Look for changes in your nipples, such as: Discharge. Bleeding. Dimpling. Redness. A nipple that looks pushed in (retracted), or that has changed position. Feel for changes Carefully feel your breasts for lumps and changes. It is best to do this self-exam while lying down. Follow these steps to feel each breast: Place a pillow under the shoulder of one side of your body. Place the arm of that side of your body behind your head. Feel the breast of that side of your body using the hand of the opposite arm. To do this: Start in the nipple area and use the pads of your three middle fingers to make -inch (2 cm) overlapping circles. Use light, medium, and then firm pressure as you feel your breast, gently covering the entire breast area and armpit. Continue the overlapping circles, moving downward over the breast until you feel your ribs below your breast. Then, make circles with your fingers going upward until you reach your collarbone. Next, make circles by moving outward across your breast and into your  armpit area. Squeeze the nipple. Check for discharge and lumps. Repeat steps 1-7 to check your other breast. Sit or stand in the tub or shower. With soapy water on your skin, feel each breast the same way you did when you were lying down. Write down what you find Writing down what you find can help you remember what to discuss with your health care provider. Write down: What is normal for each breast. Any changes that you find in each breast. These include: The kind of changes you find. Any pain or  tenderness. Size and location of any lumps. Where you are in your menstrual cycle, if you are still getting your menstrual period (menstruating). General tips If you are breastfeeding, the best time to examine your breasts is after a feeding or after using a breast pump. If you menstruate, the best time to examine your breasts is 5-7 days after your menstrual period. Breasts are generally lumpier during menstrual periods, and it may be more difficult to notice changes. With time and practice, you will become more familiar with the differences in your breasts and more comfortable with the exam. Contact a health care provider if: You see a change in the shape or size of your breasts or nipples. You see a change in the skin of your breast or nipples, such as a reddened or scaly area. You have unusual discharge from your nipples. You find a new lump or thick area. You have breast pain. You have any concerns about your breast health. Summary Breast self-awareness includes looking for physical changes in your breasts and feeling for any changes within your breasts. Breast self-awareness should be done in front of a mirror in a well-lit room. If you menstruate, the best time to examine your breasts is 5-7 days after your menstrual period. Tell your health care provider about any changes you notice in your breasts. Changes include changes in size, changes on the skin, pain or tenderness, or unusual fluid from your nipples. This information is not intended to replace advice given to you by your health care provider. Make sure you discuss any questions you have with your health care provider. Document Revised: 11/28/2021 Document Reviewed: 04/25/2021 Elsevier Patient Education  2024 [employer].

## 2023-11-19 ENCOUNTER — Ambulatory Visit: Payer: Self-pay

## 2023-11-19 DIAGNOSIS — O24919 Unspecified diabetes mellitus in pregnancy, unspecified trimester: Secondary | ICD-10-CM

## 2023-11-19 DIAGNOSIS — Z0000 Encounter for general adult medical examination without abnormal findings: Secondary | ICD-10-CM

## 2023-11-19 LAB — POCT URINALYSIS DIPSTICK
Glucose, UA: NEGATIVE
Nitrite, UA: NEGATIVE
Protein, UA: NEGATIVE
Spec Grav, UA: <=1.005 — AB (ref 1.010–1.025)
Urobilinogen, UA: 0.2 U/dL
pH, UA: 7 (ref 5.0–8.0)

## 2023-11-19 NOTE — Progress Notes (Signed)
 Pt completed labs for physical. Sara Burch

## 2023-11-21 LAB — CMP12+LP+TP+TSH+6AC+CBC/D/PLT
ALT: 19 IU/L (ref 0–32)
AST: 18 IU/L (ref 0–40)
Albumin: 4.4 g/dL (ref 3.9–4.9)
Alkaline Phosphatase: 95 IU/L (ref 44–121)
BUN/Creatinine Ratio: 13 (ref 9–23)
BUN: 8 mg/dL (ref 6–20)
Basophils Absolute: 0 10*3/uL (ref 0.0–0.2)
Basos: 0 %
Bilirubin Total: 0.6 mg/dL (ref 0.0–1.2)
Calcium: 8.9 mg/dL (ref 8.7–10.2)
Chloride: 100 mmol/L (ref 96–106)
Chol/HDL Ratio: 4.5 ratio — ABNORMAL HIGH (ref 0.0–4.4)
Cholesterol, Total: 203 mg/dL — ABNORMAL HIGH (ref 100–199)
Creatinine, Ser: 0.61 mg/dL (ref 0.57–1.00)
EOS (ABSOLUTE): 0.1 10*3/uL (ref 0.0–0.4)
Eos: 1 %
Estimated CHD Risk: 1.1 times avg. — ABNORMAL HIGH (ref 0.0–1.0)
Free Thyroxine Index: 2 (ref 1.2–4.9)
GGT: 18 IU/L (ref 0–60)
Globulin, Total: 2.9 g/dL (ref 1.5–4.5)
Glucose: 88 mg/dL (ref 70–99)
HDL: 45 mg/dL (ref >39)
Hematocrit: 40.5 % (ref 34.0–46.6)
Hemoglobin: 13.1 g/dL (ref 11.1–15.9)
Immature Grans (Abs): 0 10*3/uL (ref 0.0–0.1)
Immature Granulocytes: 0 %
Iron: 84 ug/dL (ref 27–159)
LDH: 180 IU/L (ref 119–226)
LDL Chol Calc (NIH): 145 mg/dL — ABNORMAL HIGH (ref 0–99)
Lymphocytes Absolute: 2.2 10*3/uL (ref 0.7–3.1)
Lymphs: 50 %
MCH: 29.1 pg (ref 26.6–33.0)
MCHC: 32.3 g/dL (ref 31.5–35.7)
MCV: 90 fL (ref 79–97)
Monocytes Absolute: 0.3 10*3/uL (ref 0.1–0.9)
Monocytes: 8 %
Neutrophils Absolute: 1.8 10*3/uL (ref 1.4–7.0)
Neutrophils: 41 %
Phosphorus: 2.5 mg/dL — ABNORMAL LOW (ref 3.0–4.3)
Platelets: 207 10*3/uL (ref 150–450)
Potassium: 4.3 mmol/L (ref 3.5–5.2)
RBC: 4.5 x10E6/uL (ref 3.77–5.28)
RDW: 13.9 % (ref 11.7–15.4)
Sodium: 139 mmol/L (ref 134–144)
T3 Uptake Ratio: 31 % (ref 24–39)
T4, Total: 6.4 ug/dL (ref 4.5–12.0)
TSH: 1.8 u[IU]/mL (ref 0.450–4.500)
Total Protein: 7.3 g/dL (ref 6.0–8.5)
Triglycerides: 69 mg/dL (ref 0–149)
Uric Acid: 4.1 mg/dL (ref 2.6–6.2)
VLDL Cholesterol Cal: 13 mg/dL (ref 5–40)
WBC: 4.4 10*3/uL (ref 3.4–10.8)
eGFR: 117 mL/min/{1.73_m2} (ref >59)

## 2023-11-21 LAB — HGB A1C W/O EAG: Hgb A1c MFr Bld: 6.1 % — ABNORMAL HIGH (ref 4.8–5.6)

## 2023-11-26 ENCOUNTER — Encounter: Payer: Self-pay | Admitting: Physician Assistant

## 2023-11-26 ENCOUNTER — Ambulatory Visit: Payer: Self-pay | Admitting: Physician Assistant

## 2023-11-26 VITALS — BP 141/83 | HR 77 | Temp 97.8°F | Resp 14 | Ht 66.0 in | Wt 239.0 lb

## 2023-11-26 DIAGNOSIS — Z0000 Encounter for general adult medical examination without abnormal findings: Secondary | ICD-10-CM

## 2023-11-26 NOTE — Progress Notes (Signed)
 [facility] [place] ____________________________________________   None    (approximate)  I have reviewed the triage vital signs and the nursing notes.   HISTORY  Chief Complaint No chief complaint on file.   HPI Sara Burch is a 38 y.o. female patient presents for annual physical exam.  Voices concern for options for weight loss.         Past Medical History:  Diagnosis Date   Miscarriage     Patient Active Problem List   Diagnosis Date Noted   Abnormal LFTs 11/07/2021   Calculus of gallbladder without cholecystitis without obstruction 11/05/2021   H/O fibromyalgia 12/14/2019   Carpal tunnel syndrome, bilateral 05/26/2012   Family history of thyroid  disorder 01/03/2012   Pregnancy, normal first 01/03/2012    Past Surgical History:  Procedure Laterality Date   CESAREAN SECTION  09/25/2021   Procedure: CESAREAN SECTION;  Surgeon: Teresa Fender, MD;  Location: [facility] ORS;  Service: Obstetrics;;    Prior to Admission medications   Medication Sig Start Date End Date Taking? Authorizing Provider  b complex vitamins capsule Take 1 capsule by mouth daily. Every other day    [provider]  Cetirizine HCl (ZYRTEC PO) Take by mouth.    [provider]  [facility] Liver Oil 10 MINIM CAPS Take by mouth.    [provider]  Prenatal Vit-Fe Fumarate-FA (MULTIVITAMIN-PRENATAL) 27-0.8 MG TABS tablet Take 1 tablet by mouth daily at 12 noon.    [provider]  tranexamic acid  (LYSTEDA ) 650 MG TABS tablet Take 2 tablets (1,300 mg total) by mouth 3 (three) times daily. Take during menses for a maximum of five days 05/05/23   Teresa Fender, MD    Allergies Patient has no known allergies.  Family History  Problem Relation Age of Onset   Healthy Mother    Diabetes Father    Breast cancer Neg Hx    Ovarian cancer Neg Hx    Colon cancer Neg Hx     Social History Social History   Tobacco Use   Smoking status: Never     Passive exposure: Never   Smokeless tobacco: Never  Vaping Use   Vaping status: Never Used  Substance Use Topics   Alcohol use: Not Currently    Comment: rare   Drug use: No    Review of Systems Constitutional: No fever/chills.  Obesity Eyes: No visual changes. ENT: No sore throat. Cardiovascular: Denies chest pain. Respiratory: Denies shortness of breath. Gastrointestinal: No abdominal pain.  No nausea, no vomiting.  No diarrhea.  No constipation. Genitourinary: Negative for dysuria. Musculoskeletal: Negative for back pain. Skin: Negative for rash. Neurological: Negative for headaches, focal weakness or numbness.  ____________________________________________   PHYSICAL EXAM:  VITAL SIGNS:  11/26/2023   9:53 AM    BP 141/83BP. 141/83. Data is abnormal. Taken on 11/26/23 9:53 AM  Cuff Size Large  Pulse Rate 77  Temp 97.8 F (36.6 C)  Temp Source Temporal  Weight 239 lb (108.4 kg)  Height 5\' 6"  (1.676 m)  Resp 14  SpO2 98 %   BMI: 38.58 kg/m2  BSA: 2.25 m2   Constitutional: Alert and oriented. Well appearing and in no acute distress.  Obesity Eyes: Conjunctivae are normal. PERRL. EOMI. Head: Atraumatic. Nose: No congestion/rhinnorhea. Mouth/Throat: Mucous membranes are moist.  Oropharynx non-erythematous. Neck: No stridor. No cervical spine tenderness to palpation. Hematological/Lymphatic/Immunilogical: No cervical lymphadenopathy. Cardiovascular: Normal rate, regular rhythm. Grossly normal heart sounds.  Good peripheral circulation. Respiratory: Normal  respiratory effort.  No retractions. Lungs CTAB. Gastrointestinal: Soft and nontender. No distention. No abdominal bruits. No CVA tenderness. Genitourinary: Deferred Musculoskeletal: No lower extremity tenderness nor edema.  No joint effusions. Neurologic:  Normal speech and language. No gross focal neurologic deficits are appreciated. No gait instability. Skin:  Skin is warm, dry and intact. No rash  noted. Psychiatric: Mood and affect are normal. Speech and behavior are normal.  ____________________________________________   LABS ____________________________________________   ____________________________________________   INITIAL IMPRESSION / ASSESSMENT AND PLAN  As part of my medical decision making, I reviewed the following data within the electronic MEDICAL RECORD NUMBER       No acute findings on physical exam and labs.  Patient has mildly elevated cholesterol.  Triglycerides and HDL within normal range.  Patient will receive a consult to nutritionist for weight management.      ____________________________________________   FINAL CLINICAL IMPRESSION Well exam    ED Discharge Orders     None        Note:  This document was prepared using Dragon voice recognition software and may include unintentional dictation errors.

## 2023-11-26 NOTE — Progress Notes (Signed)
 Pt presents today to complete physical, pt would like some insight on nutrition and weight loss.

## 2024-02-27 LAB — GLUCOSE, POCT (MANUAL RESULT ENTRY): POC Glucose: 108 mg/dL — AB (ref 70–99)

## 2024-02-29 NOTE — Congregational Nurse Program (Signed)
  Dept: [phone]   Congregational Nurse Program Note  Date of Encounter: 02/27/2024  Patient encouraged to decrease salt intake and increase water intake, patient was given know your numbers handout as well as handout for decreasing BP.  Not on BP medications currently and has October appointment with her PCP, encouraged to follow up about increased BP concern.  Past Medical History: Past Medical History:  Diagnosis Date   Miscarriage     Encounter Details:  Community Questionnaire - 02/27/24 1030       Questionnaire   Ask client: Do you give verbal consent for me to treat you today? 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

## 2024-03-25 DIAGNOSIS — F4321 Adjustment disorder with depressed mood: Secondary | ICD-10-CM | POA: Diagnosis not present

## 2024-05-03 NOTE — Progress Notes (Unsigned)
 GYNECOLOGY ANNUAL PHYSICAL EXAM PROGRESS NOTE  Subjective:    Sara Burch is a 38 y.o. G65P1031 female who presents for an annual exam.  The patient {is/is not/has never been:13135} sexually active. The patient participates in regular exercise: {yes/no/not asked:9010}. Has the patient ever been transfused or tattooed?: {yes/no/not asked:9010}. The patient reports that there {is/is not:9024} domestic violence in her life.   The patient has the following complaints today:   Menstrual History: Menarche age: *** No LMP recorded.     Gynecologic History:  Contraception: {method:5051} History of STI's:  Last Pap: ***. Results were: {norm/abn:16337}.  ***Denies/Notes h/o abnormal pap smears. Last mammogram: ***. Results were: {norm/abn:16337}       OB History  Gravida Para Term Preterm AB Living  5 1 1  0 3 1  SAB IAB Ectopic Multiple Live Births  2 0 0 0 1    # Outcome Date GA Lbr Len/2nd Weight Sex Type Anes PTL Lv  5 Gravida           4 SAB 2022          3 SAB 2022          2 Term 2014   9 lb 1.8 oz (4.132 kg)  Vag-Spont   LIV  1 AB 2004            Past Medical History:  Diagnosis Date   Miscarriage     Past Surgical History:  Procedure Laterality Date   CESAREAN SECTION  09/25/2021   Procedure: CESAREAN SECTION;  Surgeon: Connell Davies, MD;  Location: [facility] ORS;  Service: Obstetrics;;    Family History  Problem Relation Age of Onset   Healthy Mother    Diabetes Father    Breast cancer Neg Hx    Ovarian cancer Neg Hx    Colon cancer Neg Hx     Social History   Socioeconomic History   Marital status: Married    Spouse name: Not on file   Number of children: Not on file   Years of education: Not on file   Highest education level: Not on file  Occupational History   Not on file  Tobacco Use   Smoking status: Never    Passive exposure: Never   Smokeless tobacco: Never  Vaping Use   Vaping status: Never Used  Substance and Sexual Activity    Alcohol use: Not Currently    Comment: rare   Drug use: No   Sexual activity: Yes    Birth control/protection: None  Other Topics Concern   Not on file  Social History Narrative   Not on file   Social Drivers of Health   Financial Resource Strain: Not on file  Food Insecurity: Not on file  Transportation Needs: Not on file  Physical Activity: Not on file  Stress: Not on file  Social Connections: Not on file  Intimate Partner Violence: Not on file    Current Outpatient Medications on File Prior to Visit  Medication Sig Dispense Refill   b complex vitamins capsule Take 1 capsule by mouth daily. Every other day     Cetirizine HCl (ZYRTEC PO) Take by mouth.     [facility] Liver Oil 10 MINIM CAPS Take by mouth.     Prenatal Vit-Fe Fumarate-FA (MULTIVITAMIN-PRENATAL) 27-0.8 MG TABS tablet Take 1 tablet by mouth daily at 12 noon.     tranexamic acid  (LYSTEDA ) 650 MG TABS tablet Take 2 tablets (1,300 mg total) by mouth 3 (three) times  daily. Take during menses for a maximum of five days 30 tablet 2   No current facility-administered medications on file prior to visit.    No Known Allergies   Review of Systems Constitutional: negative for chills, fatigue, fevers and sweats Eyes: negative for irritation, redness and visual disturbance Ears, nose, mouth, throat, and face: negative for hearing loss, nasal congestion, snoring and tinnitus Respiratory: negative for asthma, cough, sputum Cardiovascular: negative for chest pain, dyspnea, exertional chest pressure/discomfort, irregular heart beat, palpitations and syncope Gastrointestinal: negative for abdominal pain, change in bowel habits, nausea and vomiting Genitourinary: negative for abnormal menstrual periods, genital lesions, sexual problems and vaginal discharge, dysuria and urinary incontinence Integument/breast: negative for breast lump, breast tenderness and nipple discharge Hematologic/lymphatic: negative for bleeding and easy  bruising Musculoskeletal:negative for back pain and muscle weakness Neurological: negative for dizziness, headaches, vertigo and weakness Endocrine: negative for diabetic symptoms including polydipsia, polyuria and skin dryness Allergic/Immunologic: negative for hay fever and urticaria      Objective:  currently breastfeeding. There is no height or weight on file to calculate BMI.    General Appearance:    Alert, cooperative, no distress, appears stated age  Head:    Normocephalic, without obvious abnormality, atraumatic  Eyes:    PERRL, conjunctiva/corneas clear, EOM's intact, both eyes  Ears:    Normal external ear canals, both ears  Nose:   Nares normal, septum midline, mucosa normal, no drainage or sinus tenderness  Throat:   Lips, mucosa, and tongue normal; teeth and gums normal  Neck:   Supple, symmetrical, trachea midline, no adenopathy; thyroid : no enlargement/tenderness/nodules; no carotid bruit or JVD  Back:     Symmetric, no curvature, ROM normal, no CVA tenderness  Lungs:     Clear to auscultation bilaterally, respirations unlabored  Chest Wall:    No tenderness or deformity   Heart:    Regular rate and rhythm, S1 and S2 normal, no murmur, rub or gallop  Breast Exam:    No tenderness, masses, or nipple abnormality  Abdomen:     Soft, non-tender, bowel sounds active all four quadrants, no masses, no organomegaly.    Genitalia:    Pelvic:external genitalia normal, vagina without lesions, discharge, or tenderness, rectovaginal septum  normal. Cervix normal in appearance, no cervical motion tenderness, no adnexal masses or tenderness.  Uterus normal size, shape, mobile, regular contours, nontender.  Rectal:    Normal external sphincter.  No hemorrhoids appreciated. Internal exam not done.   Extremities:   Extremities normal, atraumatic, no cyanosis or edema  Pulses:   2+ and symmetric all extremities  Skin:   Skin color, texture, turgor normal, no rashes or lesions  Lymph nodes:    Cervical, supraclavicular, and axillary nodes normal  Neurologic:   CNII-XII intact, normal strength, sensation and reflexes throughout   .  Labs:  Lab Results  Component Value Date   WBC 4.4 11/19/2023   HGB 13.1 11/19/2023   HCT 40.5 11/19/2023   MCV 90 11/19/2023   PLT 207 11/19/2023    Lab Results  Component Value Date   CREATININE 0.61 11/19/2023   BUN 8 11/19/2023   NA 139 11/19/2023   K 4.3 11/19/2023   CL 100 11/19/2023   CO2 25 11/01/2021    Lab Results  Component Value Date   ALT 19 11/19/2023   AST 18 11/19/2023   GGT 18 11/19/2023   ALKPHOS 95 11/19/2023   BILITOT 0.6 11/19/2023    Lab Results  Component Value  Date   TSH 1.800 11/19/2023     Assessment:   No diagnosis found.   Plan:  Blood tests: {blood tests:13147}. Breast self exam technique reviewed and patient encouraged to perform self-exam monthly. Contraception: {contraceptive methods:5051}. Discussed healthy lifestyle modifications. Mammogram {discussed/ordered:14545} Pap smear {discussed/ordered:14545}. Flu vaccine: Follow up in 1 year for annual exam   Kizzie Camelia CROME, CMA [facility]

## 2024-05-05 ENCOUNTER — Other Ambulatory Visit (HOSPITAL_COMMUNITY)
Admission: RE | Admit: 2024-05-05 | Discharge: 2024-05-05 | Disposition: A | Discharge status: Home / Self Care | Source: Ambulatory Visit | Attending: Certified Nurse Midwife | Admitting: Certified Nurse Midwife

## 2024-05-05 ENCOUNTER — Ambulatory Visit (INDEPENDENT_AMBULATORY_CARE_PROVIDER_SITE_OTHER): Admitting: Certified Nurse Midwife

## 2024-05-05 ENCOUNTER — Encounter: Payer: Self-pay | Admitting: Certified Nurse Midwife

## 2024-05-05 VITALS — BP 134/88 | HR 78 | Resp 16 | Ht 66.0 in | Wt 227.2 lb

## 2024-05-05 DIAGNOSIS — Z01419 Encounter for gynecological examination (general) (routine) without abnormal findings: Secondary | ICD-10-CM | POA: Insufficient documentation

## 2024-05-05 DIAGNOSIS — G4700 Insomnia, unspecified: Secondary | ICD-10-CM | POA: Diagnosis not present

## 2024-05-05 DIAGNOSIS — Z131 Encounter for screening for diabetes mellitus: Secondary | ICD-10-CM

## 2024-05-05 DIAGNOSIS — Z8349 Family history of other endocrine, nutritional and metabolic diseases: Secondary | ICD-10-CM | POA: Diagnosis not present

## 2024-05-05 DIAGNOSIS — E669 Obesity, unspecified: Secondary | ICD-10-CM

## 2024-05-05 DIAGNOSIS — Z13220 Encounter for screening for lipoid disorders: Secondary | ICD-10-CM | POA: Diagnosis not present

## 2024-05-05 DIAGNOSIS — Z124 Encounter for screening for malignant neoplasm of cervix: Secondary | ICD-10-CM

## 2024-05-05 DIAGNOSIS — Z6836 Body mass index (BMI) 36.0-36.9, adult: Secondary | ICD-10-CM

## 2024-05-05 NOTE — Patient Instructions (Signed)
 Magnesium  500-1000mg  daily for insomnia and muscle weakness/pain.  Red clover tincture has been studied in recent scientific literature and has been proven to improve hot flashes and insomnia. Here a link to one that I like but of course there are many options. https://napiers.net/products/red-clover-tincture-trifolium-pratense  I also think you would benefit from black cohosh.  Wild yam and Chasteberry are phytoestrogens that mimic the role of estrogen in the body. My favorite blend with these is Menopausal Relief tincture for [facility] http://www.santiago.info/  Mood support herbs that I would recommend  would be Daily lemon balm  Ashwaganda  [place]   For more acute mood feeling days Kava root tincture.

## 2024-05-06 LAB — FSH/LH
FSH: 6.7 m[IU]/mL
LH: 3.3 m[IU]/mL

## 2024-05-06 LAB — CBC
Hematocrit: 40.3 % (ref 34.0–46.6)
Hemoglobin: 13.6 g/dL (ref 11.1–15.9)
MCH: 30.6 pg (ref 26.6–33.0)
MCHC: 33.7 g/dL (ref 31.5–35.7)
MCV: 91 fL (ref 79–97)
Platelets: 195 x10E3/uL (ref 150–450)
RBC: 4.44 x10E6/uL (ref 3.77–5.28)
RDW: 13.5 % (ref 11.7–15.4)
WBC: 4.2 x10E3/uL (ref 3.4–10.8)

## 2024-05-06 LAB — COMPREHENSIVE METABOLIC PANEL WITH GFR
ALT: 14 IU/L (ref 0–32)
AST: 18 IU/L (ref 0–40)
Albumin: 4.5 g/dL (ref 3.9–4.9)
Alkaline Phosphatase: 76 IU/L (ref 41–116)
BUN/Creatinine Ratio: 13 (ref 9–23)
BUN: 9 mg/dL (ref 6–20)
Bilirubin Total: 0.4 mg/dL (ref 0.0–1.2)
CO2: 22 mmol/L (ref 20–29)
Calcium: 9.4 mg/dL (ref 8.7–10.2)
Chloride: 101 mmol/L (ref 96–106)
Creatinine, Ser: 0.69 mg/dL (ref 0.57–1.00)
Globulin, Total: 2.8 g/dL (ref 1.5–4.5)
Glucose: 94 mg/dL (ref 70–99)
Potassium: 4.4 mmol/L (ref 3.5–5.2)
Sodium: 138 mmol/L (ref 134–144)
Total Protein: 7.3 g/dL (ref 6.0–8.5)
eGFR: 114 mL/min/1.73 (ref >59)

## 2024-05-06 LAB — TSH: TSH: 1.64 u[IU]/mL (ref 0.450–4.500)

## 2024-05-06 LAB — HEMOGLOBIN A1C
Est. average glucose Bld gHb Est-mCnc: 128 mg/dL
Hgb A1c MFr Bld: 6.1 % — ABNORMAL HIGH (ref 4.8–5.6)

## 2024-05-06 LAB — LIPID PANEL
Chol/HDL Ratio: 4.1 ratio (ref 0.0–4.4)
Cholesterol, Total: 200 mg/dL — ABNORMAL HIGH (ref 100–199)
HDL: 49 mg/dL (ref >39)
LDL Chol Calc (NIH): 140 mg/dL — ABNORMAL HIGH (ref 0–99)
Triglycerides: 58 mg/dL (ref 0–149)
VLDL Cholesterol Cal: 11 mg/dL (ref 5–40)

## 2024-05-06 LAB — CYTOLOGY - PAP
Comment: NEGATIVE
Diagnosis: NEGATIVE
High risk HPV: NEGATIVE

## 2024-05-09 ENCOUNTER — Ambulatory Visit: Payer: Self-pay | Admitting: Certified Nurse Midwife
